# Patient Record
Sex: Female | Born: 1990 | Race: White | Hispanic: No | Marital: Married | Smoking: Never smoker
Health system: Southern US, Community
[De-identification: ages and names within clinical notes are randomized; demographics above are authoritative.]

## PROBLEM LIST (undated history)

## (undated) ENCOUNTER — Inpatient Hospital Stay (HOSPITAL_COMMUNITY): Payer: Self-pay

## (undated) DIAGNOSIS — N390 Urinary tract infection, site not specified: Secondary | ICD-10-CM

## (undated) DIAGNOSIS — G709 Myoneural disorder, unspecified: Secondary | ICD-10-CM

## (undated) DIAGNOSIS — F32A Depression, unspecified: Secondary | ICD-10-CM

## (undated) DIAGNOSIS — F329 Major depressive disorder, single episode, unspecified: Secondary | ICD-10-CM

## (undated) DIAGNOSIS — F419 Anxiety disorder, unspecified: Secondary | ICD-10-CM

## (undated) DIAGNOSIS — K589 Irritable bowel syndrome without diarrhea: Secondary | ICD-10-CM

## (undated) DIAGNOSIS — M4120 Other idiopathic scoliosis, site unspecified: Secondary | ICD-10-CM

## (undated) HISTORY — DX: Urinary tract infection, site not specified: N39.0

## (undated) HISTORY — DX: Irritable bowel syndrome, unspecified: K58.9

## (undated) HISTORY — DX: Major depressive disorder, single episode, unspecified: F32.9

## (undated) HISTORY — DX: Myoneural disorder, unspecified: G70.9

## (undated) HISTORY — DX: Other idiopathic scoliosis, site unspecified: M41.20

## (undated) HISTORY — DX: Depression, unspecified: F32.A

## (undated) HISTORY — DX: Anxiety disorder, unspecified: F41.9

---
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
## 2008-02-13 HISTORY — PX: WISDOM TOOTH EXTRACTION: SHX21

## 2008-03-17 ENCOUNTER — Ambulatory Visit: Payer: Self-pay | Admitting: Family Medicine

## 2008-03-17 LAB — CONVERTED CEMR LAB
Bilirubin Urine: NEGATIVE
Glucose, Urine, Semiquant: NEGATIVE
Ketones, urine, test strip: NEGATIVE
Nitrite: POSITIVE
Specific Gravity, Urine: <1.005
Urobilinogen, UA: 0.2
pH: 6

## 2008-06-04 ENCOUNTER — Ambulatory Visit: Payer: Self-pay | Admitting: Family Medicine

## 2008-06-04 DIAGNOSIS — R197 Diarrhea, unspecified: Secondary | ICD-10-CM | POA: Insufficient documentation

## 2008-06-04 DIAGNOSIS — R109 Unspecified abdominal pain: Secondary | ICD-10-CM | POA: Insufficient documentation

## 2008-08-02 ENCOUNTER — Ambulatory Visit: Payer: Self-pay | Admitting: Family Medicine

## 2008-08-02 DIAGNOSIS — K589 Irritable bowel syndrome without diarrhea: Secondary | ICD-10-CM | POA: Insufficient documentation

## 2008-08-02 LAB — CONVERTED CEMR LAB
Bilirubin Urine: NEGATIVE
Glucose, Urine, Semiquant: NEGATIVE
Ketones, urine, test strip: NEGATIVE
Nitrite: POSITIVE
Protein, U semiquant: 30
Specific Gravity, Urine: 1.015
Urobilinogen, UA: 0.2
pH: 6

## 2008-08-20 ENCOUNTER — Ambulatory Visit: Payer: Self-pay | Admitting: Family Medicine

## 2008-08-20 LAB — CONVERTED CEMR LAB
Bilirubin Urine: NEGATIVE
Casts: 0 /lpf
Epithelial cells, urine: 1 /lpf
Glucose, Urine, Semiquant: NEGATIVE
Ketones, urine, test strip: NEGATIVE
Nitrite: POSITIVE
Protein, U semiquant: 30
Specific Gravity, Urine: 1.015
Urine crystals, microscopic: 0 /hpf
Urobilinogen, UA: 0.2
Yeast, UA: 0
pH: 6

## 2008-08-21 ENCOUNTER — Encounter: Payer: Self-pay | Admitting: Family Medicine

## 2009-01-19 ENCOUNTER — Encounter: Payer: Self-pay | Admitting: Family Medicine

## 2009-01-19 ENCOUNTER — Ambulatory Visit: Payer: Self-pay | Admitting: Family Medicine

## 2009-01-19 DIAGNOSIS — J029 Acute pharyngitis, unspecified: Secondary | ICD-10-CM | POA: Insufficient documentation

## 2009-01-19 LAB — CONVERTED CEMR LAB: Rapid Strep: NEGATIVE

## 2009-01-20 ENCOUNTER — Telehealth: Payer: Self-pay | Admitting: Family Medicine

## 2010-03-15 NOTE — Assessment & Plan Note (Signed)
Summary: upset stomach/bir   Vital Signs:  Patient profile:   20 year old female Height:      65 inches Weight:      125.4 pounds BMI:     20.94 Temp:     97.7 degrees F oral Pulse rate:   80 / minute Pulse rhythm:   regular BP sitting:   90 / 60  (left arm) Cuff size:   regular  Vitals Entered By: Providence Crosby (June 04, 2008 1:58 PM) CC: gi upset// took goody powder and ibuprofen  for back pain   History of Present Illness: For the past 5 days she has been having stomach cramps and diarrhea. No  nausea/vomiting, no fever. Feels like stomach tied in knots, B low abdomen.. Relieved with Bm, temporarily. Worse ater meals but happens without eating as well.  6 BMs in last 24 hours. No blood in stool. No change in diet. Has had some intermiitant consipation in weeks prior.  No sick contacts.  Does drink alcohol but only 2 drinks on previous weekend.  Has had increase in stress this week with exam on Tuesday.  Took goody powder and ibuprofen  for back pain, but this had started prior toabove issues.   No recetn travel, no antibiotics since 03/2008.   Problems Prior to Update: 1)  Abdominal Cramps  (ICD-789.00) 2)  Diarrhea  (ICD-787.91) 3)  Uri  (ICD-465.9) 4)  Sinusitis- Acute-nos  (ICD-461.9) 5)  Uti  (ICD-599.0) 6)  Cellulitis, Right Leg  (ICD-682.6) 7)  Bee Sting Reaction, Local  (ICD-989.5) 8)  Hx of Premature Birth  (ICD-765.10) 9)  Hx of Scoliosis  (ICD-737.30)  Current Medications (verified): 1)  Loestrin Fe 1/20  Tabs (Norethin Ace-Eth Estrad-Fe Tabs) .... Take One By Mouth Daily As Directed Generic Kind Per Patient  Allergies: 1)  ! Keflex  Past History:  Past medical, surgical, family and social histories (including risk factors) reviewed, and no changes noted (except as noted below).  Past Surgical History:    Reviewed history from 06/11/2006 and no changes required:    5/07 pelvic US nl  Family History:    Reviewed history and no changes  required:       Twin sister with IBS.   Social History:    Reviewed history from 03/17/2008 and no changes required:       in college - for economics --is a senior at [employer] 03/2008       lives with her twin sister        no smoking or alcohol   Review of Systems General:  Complains of fatigue. CV:  Denies palpitations; Feel somewhat on edge...nervous. Resp:  Denies shortness of breath. GU:  Denies dysuria.  Physical Exam  General:  Well-developed,well-nourished,in no acute distress; alert,appropriate and cooperative throughout examination Mouth:  Oral mucosa and oropharynx without lesions or exudates.  Teeth in good repair. Neck:  no cervical or supraclavicular lymphadenopathy no carotid bruit or thyromegaly  Lungs:  Normal respiratory effort, chest expands symmetrically. Lungs are clear to auscultation, no crackles or wheezes. Heart:  Normal rate and regular rhythm. S1 and S2 normal without gallop, murmur, click, rub or other extra sounds. Abdomen:  Bowel sounds positive,abdomen soft and non-tender without masses, organomegaly or hernias noted.   Impression & Recommendations:  Problem # 1:  DIARRHEA (ICD-787.91) Likely IBS vs viral infection. Encourage increase in water, fiber and healthy eating habits.  Use yogurt with lactobacilli to improve intestinal flora.  If not improving follow up  for lab work up.  Problem # 2:  ABDOMINAL CRAMPS (ICD-789.00) no s/s of appendicitis. MAy use hyoscamine as needed severe abdominal cramps.   Complete Medication List: 1)  Loestrin Fe 1/20 Tabs (Norethin ace-eth estrad-fe tabs) .... Take one by mouth daily as directed generic kind per patient 2)  Hyoscyamine Sulfate 0.125 Mg Tbdp (Hyoscyamine sulfate) .Marland Kitchen.. 1 tab by mouth three times a day orn abdominal spasm  Patient Instructions: 1)  increase in water, fiber and healthy eating habits.  Use yogurt with lactobacilli to improve intestinal flora. 2)  Follow up if not improving with time.    Prescriptions: HYOSCYAMINE SULFATE 0.125 MG TBDP (HYOSCYAMINE SULFATE) 1 tab by mouth three times a day orn abdominal spasm  #15 x 0   Entered and Authorized by:   Kerby Nora MD   Signed by:   Kerby Nora MD on 06/04/2008   Method used:   Print then Give to Patient   RxID:   [id number]

## 2010-03-15 NOTE — Assessment & Plan Note (Signed)
Summary: ?UTI/CLE   Vital Signs:  Patient profile:   20 year old female Height:      65 inches Weight:      125 pounds Temp:     97.8 degrees F oral Pulse rate:   80 / minute Pulse rhythm:   regular BP sitting:   90 / 50  (left arm) Cuff size:   regular  Vitals Entered By: Providence Crosby LPN (August 20, 100 11:53 AM) CC: ? uti    History of Present Illness: was tx with macrobid in late june for uti -- thinks she was allergic l to it-- rash  was 7 day dose  after finishing it 2 d later -- broke out all over her body in red rash  bad itching / bumping  was also in [place]  uti got better quickly   last night - started to feel uti coming on  itch and then burning - with dysuria now  no blood in urine  no fever or nausea or vomiting is having some pressure/ soreness over bladder  no back pain     Allergies: 1)  ! Keflex 2)  ! * Macrobid  Past History:  Past Medical History: Last updated: 08/02/2008 IBS  Past Surgical History: Last updated: 06/11/2006 5/07 pelvic US nl  Family History: Last updated: 06/04/2008 Twin sister with IBS.   Social History: Last updated: 03/17/2008 in college - for economics --is a senior at [employer] 03/2008 lives with her twin sister  no smoking or alcohol   Risk Factors: Smoking Status: never (06/11/2006)  Review of Systems General:  Complains of fatigue; denies chills, fever, and loss of appetite. Resp:  Denies cough. GI:  Denies nausea and vomiting. GU:  Complains of dysuria and urinary frequency; denies discharge, hematuria, incontinence, and urinary hesitancy. Derm:  Denies itching and rash.  Physical Exam  General:  Well-developed,well-nourished,in no acute distress; alert,appropriate and cooperative throughout examination Head:  normocephalic, atraumatic, and no abnormalities observed.   Neck:  No deformities, masses, or tenderness noted. Lungs:  Normal respiratory effort, chest expands symmetrically. Lungs are clear to  auscultation, no crackles or wheezes. Heart:  Normal rate and regular rhythm. S1 and S2 normal without gallop, murmur, click, rub or other extra sounds. Abdomen:  mild suprapubic tenderness without rebound or gaurding  Msk:  no CVA tenderness  Skin:  Intact without suspicious lesions or rashes Inguinal Nodes:  No significant adenopathy Psych:  normal affect, talkative and pleasant    Impression & Recommendations:  Problem # 1:  UTI (ICD-599.0) Assessment Deteriorated with prior all/rash from macrobid will cover with cipro and update disc ways to prev utis- handout given  if re- occurs frequently- consider urol eval The following medications were removed from the medication list:    Macrobid 100 Mg Caps (Nitrofurantoin monohyd macro) .Marland Kitchen... 1 by mouth two times a day Her updated medication list for this problem includes:    Cipro 250 Mg Tabs (Ciprofloxacin hcl) .Marland Kitchen... 1 by mouth two times a day for 7 days  Orders: T-Culture, Urine ([id number]) UA Dipstick W/ Micro (manual) (47425)  Complete Medication List: 1)  Loestrin Fe 1/20 Tabs (Norethin ace-eth estrad-fe tabs) .... Take one by mouth daily as directed generic kind per patient 2)  Hyoscyamine Sulfate 0.125 Mg Tabs (Hyoscyamine sulfate) .Marland Kitchen.. 1 by mouth q 4 hours as needed abdominal spasm 3)  Cipro 250 Mg Tabs (Ciprofloxacin hcl) .Marland Kitchen.. 1 by mouth two times a day for 7 days  Patient Instructions:  1)  continue drinking lots of water 2)  call or seek care is symptoms don't improve in 2-3 days or if you develop back pain, nausea, or vomiting 3)  take the cipro as directed for uti 4)  will call when culture returns  5)  if you continue to have frequent utis- may need to discuss urology evaluation  Prescriptions: CIPRO 250 MG TABS (CIPROFLOXACIN HCL) 1 by mouth two times a day for 7 days  #14 x 0   Entered and Authorized by:   Judith Part MD   Signed by:   Judith Part MD on 08/20/2008   Method used:   Print then Give to  Patient   RxID:   [id number]   Laboratory Results   Urine Tests  Date/Time Recieved: August 20, 2008 11:54 AM Date/Time Reported: August 20, 2008 11:54 AM  Routine Urinalysis   Color: yellow Appearance: Cloudy Glucose: negative   (Normal Range: Negative) Bilirubin: negative   (Normal Range: Negative) Ketone: negative   (Normal Range: Negative) Spec. Gravity: 1.015   (Normal Range: 1.003-1.035) Blood: small   (Normal Range: Negative) pH: 6.0   (Normal Range: 5.0-8.0) Protein: 30   (Normal Range: Negative) Urobilinogen: 0.2   (Normal Range: 0-1) Nitrite: positive   (Normal Range: Negative) Leukocyte Esterace: small   (Normal Range: Negative)  Urine Microscopic WBC/hpf: 3-4 RBC/hpf: 2-3 Bacteria: many Mucous: few Epithelial: 1 Crystals/LPF: 0 Casts/LPF: 0 Yeast/HPF: 0 Other: 0

## 2010-03-15 NOTE — Assessment & Plan Note (Signed)
Summary: ST/CLE   Vital Signs:  Patient profile:   20 year old female Height:      65 inches Weight:      124 pounds BMI:     20.71 Temp:     98.5 degrees F oral Pulse rate:   80 / minute Pulse rhythm:   regular BP sitting:   102 / 70  (left arm) Cuff size:   regular  Vitals Entered By: Delilah Shan CMA Duncan Dull) (January 19, 2009 11:50 AM) CC: ST   History of Present Illness: 20 yo with sore throat since Saturday. No fever, no cough, no runny nose, no n/v/d, no rash. No chills. Has not tried anything OTC. No sick contacts.  Allergies: 1)  ! Keflex 2)  ! * Macrobid  Review of Systems      See HPI General:  Denies chills, fever, loss of appetite, and malaise. ENT:  Complains of sore throat; denies earache, nasal congestion, postnasal drainage, and sinus pressure. GI:  Denies abdominal pain, diarrhea, nausea, and vomiting.  Physical Exam  General:  Well-developed,well-nourished,in no acute distress; alert,appropriate and cooperative throughout examination Ears:  External ear exam shows no significant lesions or deformities.  Otoscopic examination reveals clear canals, tympanic membranes are intact bilaterally without bulging, retraction, inflammation or discharge. Hearing is grossly normal bilaterally. Nose:  no external deformity.   Mouth:  mild erythema, no exudates. Neck:  No deformities, masses, or tenderness noted. No adenopathy. Lungs:  Normal respiratory effort, chest expands symmetrically. Lungs are clear to auscultation, no crackles or wheezes. Heart:  Normal rate and regular rhythm. S1 and S2 normal without gallop, murmur, click, rub or other extra sounds.   Impression & Recommendations:  Problem # 1:  ACUTE PHARYNGITIS (ICD-462) Assessment New Likely viral.   No cardinal signs of strep- afebrile, no adenopathy, no exudate.  Rapid strep neg.  Advised Tylenol/Motrin for supportive care.  Red flags (above cardinal signs) given for follow up.  Complete  Medication List: 1)  Loestrin Fe 1/20 Tabs (Norethin ace-eth estrad-fe tabs) .... Take one by mouth daily as directed generic kind per patient 2)  Hyoscyamine Sulfate 0.125 Mg Tabs (Hyoscyamine sulfate) .Marland Kitchen.. 1 by mouth q 4 hours as needed abdominal spasm  Current Allergies (reviewed today): ! [facility] ! * MACROBID  Laboratory Results   Date/Time Reported: January 19, 2009 12:04 PM   Other Tests  Rapid Strep: negative

## 2010-03-15 NOTE — Assessment & Plan Note (Signed)
Summary: SINUS INFECTION/HEA   Vital Signs:  Patient Profile:   20 Years Old Female Height:     64 inches (162.56 cm) Weight:      124 pounds Temp:     97.9 degrees F oral Pulse rate:   88 / minute Pulse rhythm:   regular BP sitting:   100 / 50  (left arm) Cuff size:   regular  Vitals Entered By: Lowella Petties (December 31, 2006 8:24 AM)                 Chief Complaint:  Cough and congestion.  History of Present Illness: had 2 weeks of cold symptoms- then much worse on monday really bad congestion in nose- miserable today- not as bad but cough is gettin worse had a fever for last few days is having some sinus headaches- with yellow /green to clear drainage  did use 4 way nasal spray  Current Allergies: ! KEFLEX     Review of Systems      See HPI  General      Complains of fatigue and fever.  Eyes      Complains of eye irritation.  ENT      Complains of nasal congestion, postnasal drainage, sinus pressure, and sore throat.      Denies earache.      st was worse for a while  Resp      Complains of cough and sputum productive.      a little productive  GI      Denies diarrhea, nausea, and vomiting.  Derm      Denies rash.  Psych      Denies anxiety and depression.   Physical Exam  General:     Well-developed,well-nourished,in no acute distress; alert,appropriate and cooperative throughout examination Head:     normocephalic, atraumatic, and no abnormalities observed.  sinus tenderness ethmoid and maxillary Eyes:     vision grossly intact, pupils equal, pupils round, pupils reactive to light, and no injection.   Ears:     R ear normal and L ear normal.   Nose:     mucosal erythema and mucosal edema.   Mouth:     pharynx pink and moist.   Neck:     No deformities, masses, or tenderness noted. Lungs:     Normal respiratory effort, chest expands symmetrically. Lungs are clear to auscultation, no crackles or wheezes. Heart:     Normal  rate and regular rhythm. S1 and S2 normal without gallop, murmur, click, rub or other extra sounds. Skin:     Intact without suspicious lesions or rashes Cervical Nodes:     No lymphadenopathy noted Psych:     nl affect, pleasant    Impression & Recommendations:  Problem # 1:  SINUSITIS- ACUTE-NOS (ICD-461.9) will tx with bactrim and fluids/sympt care stop the otc 4 way ns- as it could cause rebound congestion- try saline instead adv to update if worse or not inp in 4-5 days Her updated medication list for this problem includes:    Bactrim Ds 800-160 Mg Tabs (Sulfamethoxazole-trimethoprim) .Marland Kitchen... 1 tab by mouth two times daily for 10 days   Complete Medication List: 1)  Loestrin Fe 1/20 Tabs (Norethin ace-eth estrad-fe tabs) .... Take one by mouth daily as directed 2)  Bactrim Ds 800-160 Mg Tabs (Sulfamethoxazole-trimethoprim) .Marland Kitchen.. 1 tab by mouth two times daily for 10 days   Patient Instructions: 1)  you can try mucinex over the counter twice daily as directed  and nasal saline spray for congestion 2)  tylenol over the counter as directed may help with aches, headache and fever 3)  call if symptoms worsen or if not improved in 4-5 days 4)  take the bactrim with full meals as directed    Prescriptions: BACTRIM DS 800-160 MG  TABS (SULFAMETHOXAZOLE-TRIMETHOPRIM) 1 tab by mouth two times daily for 10 days  #20 x 0   Entered and Authorized by:   Judith Part MD   Signed by:   Judith Part MD on 12/31/2006   Method used:   Print then Give to Patient   RxID:   [id number]  ]

## 2010-03-15 NOTE — Progress Notes (Signed)
Summary: Office Visit  Office Visit   Imported By: Beau Fanny 06/25/2006 13:24:59  _____________________________________________________________________  External Attachment:    Type:   Image     Comment:   External Document

## 2010-03-15 NOTE — Letter (Signed)
Summary: Out of Work  [facility]  [address] [place], [place] [address]   Phone: [phone]  Fax: [phone]    January 19, 2009   Employee:  [name]    To Whom It May Concern:   To whom it may concern, Karen Booker was seen in our office today.  Please excuse her.   If you need additional information, please feel free to contact our office.         Sincerely,    Ruthe Mannan MD

## 2010-03-15 NOTE — Assessment & Plan Note (Signed)
Summary: CPX/BIR   Vital Signs:  Patient Profile:   20 Years Old Female Height:     64 inches (162.56 cm) Weight:      124 pounds Temp:     98 degrees F oral Pulse rate:   86 / minute Pulse rhythm:   regular BP sitting:   92 / 50  (left arm) Cuff size:   regular  Vitals Entered By: Lowella Petties (Jun 24, 2006 3:01 PM)               Chief Complaint:  check up.  History of Present Illness: note- this exam was done on paper and will be scanned in due to computer outage at the time              Meningococcal Vaccine    Vaccine Type: Menactra    Site: left deltoid    Mfr: sanofi pasteur    Dose: 0.5 ml    Route: IM    Given by: Lowella Petties    Exp. Date: 04/22/2007    Lot #: [id number]

## 2010-03-15 NOTE — Progress Notes (Signed)
Summary: more syptoms  Phone Note Call from Patient Call back at [phone]   Caller: Patient Call For: Dr. Dayton Martes Summary of Call: Pt says she has developed a fever and vomiting. Please advise Initial call taken by: Silas Sacramento CMA,  January 20, 2009 9:23 AM  Follow-up for Phone Call        Rapd strep was negative but if she is having fevers, vomiting, and sore throat, I'm ok with treating her for strep.  I will send penicillin to her pharmacy if you find out which one. Follow-up by: Ruthe Mannan MD,  January 20, 2009 9:39 AM  Additional Follow-up for Phone Call Additional follow up Details #1::        Left message on voicemail  to return call with pharmacy information.  Lugene Fuquay CMA (AAMA)  January 20, 2009 11:21 AM   [facility] [address]. Additional Follow-up by: Delilah Shan CMA (AAMA),  January 20, 2009 11:26 AM    New/Updated Medications: PENICILLIN V POTASSIUM 500 MG TABS (PENICILLIN V POTASSIUM) 1 tab by mouth three times a day x 10 days. Prescriptions: PENICILLIN V POTASSIUM 500 MG TABS (PENICILLIN V POTASSIUM) 1 tab by mouth three times a day x 10 days.  #30 x 0   Entered and Authorized by:   Ruthe Mannan MD   Signed by:   Ruthe Mannan MD on 01/20/2009   Method used:   Electronically to        [facility] [address] (retail)       [phone]       [place], [place]  [address]       Ph: [phone]       Fax: [phone]   RxID:   [id number]

## 2010-03-15 NOTE — Assessment & Plan Note (Signed)
Summary: UTI   Vital Signs:  Patient Profile:   20 Years Old Female Height:     64 inches (162.56 cm) Weight:      126.50 pounds Temp:     98.1 degrees F oral Pulse rate:   112 / minute Pulse rhythm:   regular BP sitting:   106 / 74  (left arm) Cuff size:   regular  Vitals Entered By: Delilah Shan (November 28, 2006 11:42 AM)                 Chief Complaint:  ? UTI.  Acute Visit History:      The patient complains of genitourinary symptoms.  The genitourinary symptoms have been present for 4 days.  She notes a history of dysuria and frequency.  She denies fever, flank pain, hematuria, nausea, perineal itch, urgency, vaginal discharge, vaginal odor, vaginal redness, vaginal swelling, or vomiting.  She is currently on an oral contraceptive agent.  The patient has no history of pyelonephritis or kidney stones.  Antibiotics have not been used within the last 4 weeks.  She has not had 3 or more urinary tract infections in the last 12 months.  Comments: chemical smell of urine, occ low abd pain with urinating.         Current Allergies (reviewed today): ! KEFLEX     Review of Systems      See HPI   Physical Exam  General:     Well-developed,well-nourished,in no acute distress; alert,appropriate and cooperative throughout examination Abdomen:     central low abd ttp, mild, no rebund, no CVA tenderness    Impression & Recommendations:  Problem # 1:  UTI (ICD-599.0)  The following medications were removed from the medication list:    Keflex 500 Mg Caps (Cephalexin) .Marland Kitchen... 1 by mouth two times a day for 7 days  Her updated medication list for this problem includes:    Bactrim Ds 800-160 Mg Tabs (Sulfamethoxazole-trimethoprim) .Marland Kitchen... 1 tab by mouth two times a day x 3 days  Orders: UA Dipstick W/ Micro (81[address])  Encouraged to push clear liquids, get enough rest, and take acetaminophen as needed. To be seen in 10 days if no improvement, sooner if worse.   Complete  Medication List: 1)  Loestrin Fe 1/20 Tabs (Norethin ace-eth estrad-fe tabs) .... Take one by mouth daily as directed 2)  Bactrim Ds 800-160 Mg Tabs (Sulfamethoxazole-trimethoprim) .Marland Kitchen.. 1 tab by mouth two times a day x 3 days     Prescriptions: BACTRIM DS 800-160 MG  TABS (SULFAMETHOXAZOLE-TRIMETHOPRIM) 1 tab by mouth two times a day x 3 days  #6 x 0   Entered and Authorized by:   Kerby Nora MD   Signed by:   Kerby Nora MD on 11/28/2006   Method used:   Print then Give to Patient   RxID:   [id number]  ] Current Allergies (reviewed today): ! KEFLEX Current Medications (including changes made in today's visit):  LOESTRIN FE 1/20  TABS (NORETHIN ACE-ETH ESTRAD-FE TABS) Take one by mouth daily as directed BACTRIM DS 800-160 MG  TABS (SULFAMETHOXAZOLE-TRIMETHOPRIM) 1 tab by mouth two times a day x 3 days   Laboratory Results   Urine Tests  Date/Time Recieved: November 28, 2006 11:55 AM  Date/Time Reported: November 28, 2006 11:55 AM   Routine Urinalysis   Color: yellow Appearance: Clear Glucose: negative   (Normal Range: Negative) Bilirubin: negative   (Normal Range: Negative) Ketone: negative   (Normal Range: Negative) Spec. Gravity: 1.020   (  Normal Range: 1.003-1.035) Blood: small   (Normal Range: Negative) pH: 6.5   (Normal Range: 5.0-8.0) Protein: trace   (Normal Range: Negative) Urobilinogen: negative   (Normal Range: 0-1) Nitrite: negative   (Normal Range: Negative) Leukocyte Esterace: negative   (Normal Range: Negative)  Urine Microscopic WBC/hpf: 10 RBC/hpf: 5-6 Epithelial: occ

## 2010-03-15 NOTE — Assessment & Plan Note (Signed)
Summary: BEE STING ON KNEE/CLE   Vital Signs:  Patient Profile:   20 Years Old Female Height:     64 inches (162.56 cm) Weight:      125 pounds Temp:     97.9 degrees F oral Pulse rate:   88 / minute Pulse rhythm:   regular BP sitting:   90 / 50  (left arm) Cuff size:   regular  Vitals Entered ByProvidence Crosby (September 25, 2006 2:05 PM)               Chief Complaint:  bee sting not heeling.  History of Present Illness: 1 week ago got stung by a bee at the gas station was painful immediately- took a while to get stinger and venom sack put ice on it took benadryl a few times still itchy, and sore- and has become bigger each day no drainage no fever, no sob or mouth or throat swelling did put some benadryl cream on it  Current Allergies: No known allergies      Review of Systems      See HPI  General      Denies chills, fatigue, and fever.  ENT      Denies sore throat.  Resp      Denies shortness of breath and wheezing.  MS      Denies joint pain.  Derm      Complains of insect bite(s) and itching.  Neuro      Denies numbness.   Physical Exam  General:     Well-developed,well-nourished,in no acute distress; alert,appropriate and cooperative throughout examination Head:     Normocephalic and atraumatic without obvious abnormalities. No apparent alopecia or balding. Eyes:     no injection.  no swelling Mouth:     pharynx pink and moist.  no  mouth or throat swelling Neck:     No deformities, masses, or tenderness noted.no cervical lymphadenopathy.   Lungs:     Normal respiratory effort, chest expands symmetrically. Lungs are clear to auscultation, no crackles or wheezes. Heart:     Normal rate and regular rhythm. S1 and S2 normal without gallop, murmur, click, rub or other extra sounds. Extremities:     No clubbing, cyanosis, edema, or deformity noted with normal full range of motion of all joints.   Skin:     area of erythema/heat/induration  on inner R knee and thigh with some tenderness, small scab in middle, and no weeping or draining Psych:     nl affect, cheerful    Impression & Recommendations:  Problem # 1:  BEE STING REACTION, LOCAL (ICD-989.5) with itching and redness that is worsening some worry for bacterial infx/cellulitis will start keflex 500 two times a day can try cool compresses and elocon cream for itch pt adv to call if redness increases (area was marked with a pen so she can tell)- or fever or other symptoms   Problem # 2:  CELLULITIS, RIGHT LEG (ICD-682.6) see above, oral abx and observation pt is aware that abx may affect eff of OC and to use back up protection Her updated medication list for this problem includes:    Keflex 500 Mg Caps (Cephalexin) .Marland Kitchen... 1 by mouth two times a day for 7 days   Complete Medication List: 1)  Loestrin Fe 1/20 Tabs (Norethin ace-eth estrad-fe tabs) .... Take one by mouth daily as directed 2)  Keflex 500 Mg Caps (Cephalexin) .Marland Kitchen.. 1 by mouth two times a day for 7  days 3)  Elocon 0.1 % Crea (Mometasone furoate) .... Apply to affected area once daily prn   Patient Instructions: 1)  you can use cool compresses on the bite 2)  keep it clean with soap and water or rubbing alcholol 3)  elocon cream is once daily 4)  keflex is twice daily (you can take it with food) 5)  if more redness, pain or any fever-call    Prescriptions: ELOCON 0.1 %  CREA (MOMETASONE FUROATE) apply to affected area once daily prn  #1 small tube x 0   Entered and Authorized by:   Judith Part MD   Signed by:   Judith Part MD on 09/25/2006   Method used:   Print then Give to Patient   RxID:   [id number] KEFLEX 500 MG  CAPS (CEPHALEXIN) 1 by mouth two times a day for 7 days  #14 x 0   Entered and Authorized by:   Judith Part MD   Signed by:   Judith Part MD on 09/25/2006   Method used:   Print then Give to Patient   RxID:   [id number]

## 2010-03-15 NOTE — Assessment & Plan Note (Signed)
Summary: COUGH,CONGESTION/CLE   Vital Signs:  Patient Profile:   20 Years Old Female Height:     64 inches (162.56 cm) Weight:      131 pounds Temp:     98.2 degrees F oral Pulse rate:   84 / minute Pulse rhythm:   regular BP sitting:   120 / 80  (right arm) Cuff size:   regular  Vitals Entered By: Liane Comber (November 27, 2007 3:46 PM)                 Chief Complaint:  cough and congestion.  History of Present Illness: started last wed with a sore throat -- in the back /sharp pain then swollen friday really tired and congested and feverish  really bad sunday -no appetite over the past week - a little better  feverish yestercday   feels better but not great  st is better  congestion -- mostly clear d/c  cough - prod -- was more yellow but now clear no wheeze  ears feels like fullness/ and pressure  vomted once due to st-- none since  no nausea or diarrhea   had some sinus pressure under the eyes- better now     Current Allergies (reviewed today): ! [facility]  Past Surgical History:    Reviewed history from 06/11/2006 and no changes required:       5/07 pelvic US nl   Social History:    in college - for economics     lives with her twin sister     no smoking or alcohol     Review of Systems  General      Complains of fatigue and loss of appetite.      Denies malaise.  Eyes      Denies blurring, discharge, and eye irritation.  ENT      Complains of hoarseness, nasal congestion, postnasal drainage, and sore throat.      Denies ear discharge.  CV      Denies chest pain or discomfort, palpitations, and shortness of breath with exertion.  Resp      Complains of cough.      Denies pleuritic, shortness of breath, and wheezing.  GI      Denies change in bowel habits.  Derm      Denies itching and rash.  Psych      mood is good    Physical Exam  General:     Well-developed,well-nourished,in no acute distress; alert,appropriate and  cooperative throughout examination Head:     normocephalic, atraumatic, and no abnormalities observed.  no sinus tenderness  Eyes:     vision grossly intact, pupils equal, pupils round, pupils reactive to light, and no injection.   Ears:     R ear normal and L ear normal.   Nose:     nares congested with clear rhinorrhea  Mouth:     pharynx pink and moist, no erythema, and no exudates.   Neck:     No deformities, masses, or tenderness noted. Lungs:     Normal respiratory effort, chest expands symmetrically. Lungs are clear to auscultation, no crackles or wheezes. Heart:     Normal rate and regular rhythm. S1 and S2 normal without gallop, murmur, click, rub or other extra sounds. Skin:     Intact without suspicious lesions or rashes Cervical Nodes:     No lymphadenopathy noted Psych:     normal affect, talkative and pleasant     Impression & Recommendations:  Problem # 1:  URI (ICD-465.9) Assessment: New will tx symptomatically  fluids and rest  expectorant and saline ns as needed  update if worse/fever or if not imp in 1 week   Complete Medication List: 1)  Loestrin Fe 1/20 Tabs (Norethin ace-eth estrad-fe tabs) .... Take one by mouth daily as directed   Patient Instructions: 1)  get plenty of rest and lots of fluids  2)  try mucinex DM for congestion and cough , and nasal saline spray is good for nasal congestion 3)  aleve is ok for headache / fever  4)  update me if not improving in 3-4 days  5)  update me if facial pain, return of fever, or worse productive cough   ]  Appended Document: Orders Update    Clinical Lists Changes  Orders: Added new Service order of Est. Patient Level III (16109) - Signed

## 2010-04-17 ENCOUNTER — Encounter: Payer: Self-pay | Admitting: Family Medicine

## 2010-04-17 ENCOUNTER — Ambulatory Visit (INDEPENDENT_AMBULATORY_CARE_PROVIDER_SITE_OTHER): Payer: 59 | Admitting: Family Medicine

## 2010-04-17 DIAGNOSIS — J029 Acute pharyngitis, unspecified: Secondary | ICD-10-CM

## 2010-04-17 LAB — CONVERTED CEMR LAB: Rapid Strep: NEGATIVE

## 2010-04-25 NOTE — Assessment & Plan Note (Signed)
Summary: ??flu   Vital Signs:  Patient profile:   20 year old female Height:      65 inches Weight:      127.25 pounds BMI:     21.25 Temp:     98.3 degrees F oral Pulse rate:   118 / minute Pulse rhythm:   regular BP sitting:   126 / 78  (left arm) Cuff size:   regular  Vitals Entered By: Selena Batten Dance CMA (AAMA) (April 17, 2010 11:22 AM) CC: ? Flu Comments Cough and fever x1 week. Roomate has same thing and was diagnosed with flu at [facility] yesterday.    History of Present Illness: CC: ? flu  1wk h/o dry cough, last few days bad sore throat with cough, now productive of yellow/clear mucous.  + fever initially to 100.  Has tried dayquil and cough drops.  + HA.  has been trying to stay hydrated.  + body aches last week.  + nasal congestion last 3 days.  No abd pain, n/v/d, rashes, myalgias, arthralgias.    thinks roommate has had flu and gave to her.  Everyone at home sick.  no h/o asthma/allergies, no smokers at home.  Current Medications (verified): 1)  Loestrin Fe 1/20  Tabs (Norethin Ace-Eth Estrad-Fe Tabs) .... Take One By Mouth Daily As Directed Generic Kind Per Patient 2)  Hyoscyamine Sulfate 0.125 Mg Tabs (Hyoscyamine Sulfate) .Marland Kitchen.. 1 By Mouth Q 4 Hours As Needed Abdominal Spasm  Allergies (verified): 1)  ! Keflex 2)  ! * Macrobid  Past History:  Past Medical History: Last updated: 08/02/2008 IBS  Social History: Last updated: 03/17/2008 in college - for economics --is a senior at [employer] 03/2008 lives with her twin sister  no smoking or alcohol   Review of Systems       per HPI  Physical Exam  General:  Well-developed,well-nourished,in no acute distress; alert,appropriate and cooperative throughout examination Head:  normocephalic, atraumatic, and no abnormalities observed.  no sinus tenderness Eyes:  PERRLA, EOMI, no injection Ears:  Tms clear bilaterally, no cerumen Nose:  nares clear bilaterally Mouth:  ++ erythema, no tonsillar enlargement  or exudates. Neck:  shotty AC LAD Lungs:  Normal respiratory effort, chest expands symmetrically. Lungs are clear to auscultation, no crackles or wheezes. Heart:  Normal rate and regular rhythm. S1 and S2 normal without gallop, murmur, click, rub or other extra sounds. Abdomen:  no HSM Pulses:  + rad pulses, brisk cap refill Extremities:  no pedal edema Skin:  Intact without suspicious lesions or rashes   Impression & Recommendations:  Problem # 1:  ACUTE PHARYNGITIS (ICD-462)  likely viral.  rapid strep negative.  supportive care.  red flags to return or seek care discussed - any SOB, pain, worsening rapid heart rate, n/v, fever >101.5.  encouraged push fluids to prevent dehydration as tachycardic today.  Orders: Rapid Strep (45409)  Complete Medication List: 1)  Loestrin Fe 1/20 Tabs (Norethin ace-eth estrad-fe tabs) .... Take one by mouth daily as directed generic kind per patient 2)  Hyoscyamine Sulfate 0.125 Mg Tabs (Hyoscyamine sulfate) .Marland Kitchen.. 1 by mouth q 4 hours as needed abdominal spasm 3)  Cheratussin Ac 100-10 Mg/51ml Syrp (Guaifenesin-codeine) .... One teaspoon at bedtime as needed cough  Patient Instructions: 1)  Sounds like you have upper respiratory infection, viral.   2)  Symptoms usually last 7-10 days.  Cough can last a few weeks after. 3)  Ensure you get plenty of fluid to prevent dehydration.  4)  Cheratussin for cough at night. 5)  Take ibuprofen for throat inflammation. 6)  Suck on cold things like popsicles or warm things like herbal teas (whichever soothes your throat better). 7)  Salt water gargles, consider throat lozenges/chloraseptic. 8)  Return if you are not improving as expected, or if you have high fevers (>101.5) or difficulty swallowing. 9)  Call clinic with questions.  Pleasure to see you today. Prescriptions: CHERATUSSIN AC 100-10 MG/5ML SYRP (GUAIFENESIN-CODEINE) one teaspoon at bedtime as needed cough  #100cc x 0   Entered and Authorized by:    Eustaquio Boyden  MD   Signed by:   Eustaquio Boyden  MD on 04/17/2010   Method used:   Print then Give to Patient   RxID:   [id number]    Orders Added: 1)  Est. Patient Level III [56213] 2)  Rapid Strep [08657]    Laboratory Results  Date/Time Received: April 17, 2010 11:52 AM  Date/Time Reported: April 17, 2010 11:52 AM   Other Tests  Rapid Strep: negative

## 2010-05-03 ENCOUNTER — Encounter: Payer: Self-pay | Admitting: Family Medicine

## 2010-05-04 ENCOUNTER — Ambulatory Visit (INDEPENDENT_AMBULATORY_CARE_PROVIDER_SITE_OTHER): Payer: 59 | Admitting: Family Medicine

## 2010-05-04 ENCOUNTER — Encounter: Payer: Self-pay | Admitting: Family Medicine

## 2010-05-04 VITALS — BP 100/70 | HR 64 | Temp 98.8°F | Ht 64.0 in | Wt 125.8 lb

## 2010-05-04 DIAGNOSIS — N390 Urinary tract infection, site not specified: Secondary | ICD-10-CM

## 2010-05-04 DIAGNOSIS — R300 Dysuria: Secondary | ICD-10-CM | POA: Insufficient documentation

## 2010-05-04 LAB — POCT URINALYSIS DIPSTICK
Bilirubin, UA: NEGATIVE
Glucose, UA: NEGATIVE
Ketones, UA: NEGATIVE
Leukocytes, UA: NEGATIVE
Protein, UA: NEGATIVE
Spec Grav, UA: 1.015
Urobilinogen, UA: NEGATIVE
pH, UA: 6.5

## 2010-05-04 NOTE — Assessment & Plan Note (Signed)
UA clear, check cx. Suspect not UTI, may be irritation from intercourse, now improved.

## 2010-05-04 NOTE — Progress Notes (Signed)
20 year old:  On Tuesday night, got a lot of burning in the middle of the night, and still had a lot of burning. Feels pretty ok last night.  No recent UTI. Increased urgency -- now improved Had intercourse Tues. No discharge or STD concerns.  [phone] [facility] on [address].  ROS: GEN: No acute illnesses, no fevers, chills. GI: No n/v/d, eating normally Pulm: No SOB Interactive and getting along well at home.  Otherwise, ROS is as per the HPI.   GEN: WDWN, NAD, Non-toxic, A & O x 3 HEENT: Atraumatic, Normocephalic. Neck supple. No masses, No LAD. Ears and Nose: No external deformity. ABD: S, NT, ND, +BS. No rebound tenderness. No HSM. No CVAT EXTR: No c/c/e NEURO Normal gait.  PSYCH: Normally interactive. Conversant. Not depressed or anxious appearing.  Calm demeanor.

## 2010-05-04 NOTE — Progress Notes (Deleted)
  Subjective:    Patient ID: Karen Booker, female    DOB: 03/31/1990, 20 y.o.   MRN: [id number]  HPI    Review of Systems     Objective:   Physical Exam        Assessment & Plan:

## 2010-05-09 NOTE — Progress Notes (Signed)
i may have signed this in Dr. Royden Purl absence. I believe no further action needs to be done. Normal UA

## 2010-11-09 ENCOUNTER — Ambulatory Visit (INDEPENDENT_AMBULATORY_CARE_PROVIDER_SITE_OTHER): Payer: 59 | Admitting: Family Medicine

## 2010-11-09 ENCOUNTER — Encounter: Payer: Self-pay | Admitting: Family Medicine

## 2010-11-09 VITALS — BP 108/74 | HR 88 | Temp 98.4°F | Wt 123.0 lb

## 2010-11-09 DIAGNOSIS — K130 Diseases of lips: Secondary | ICD-10-CM

## 2010-11-09 MED ORDER — VALACYCLOVIR HCL 1 G PO TABS: 2000.0000 mg | ORAL_TABLET | Freq: Two times a day (BID) | ORAL | Status: DC

## 2010-11-09 NOTE — Patient Instructions (Addendum)
Doesn't look like a cold sore. Will give you medicine in case develops into one - watch out for worsening pain or develops into larger blister. Looks more like chapped lips on the corner. Try chap stick to see if we can help resolve quicker. Update [place] if not improving as expected.

## 2010-11-09 NOTE — Progress Notes (Signed)
  Subjective:    Patient ID: Karen Booker, female    DOB: [birth year], 20 y.o.   MRN: [id number]  HPI CC: cold sore?  2 nights ago felt tingling left upper lip.  New lumps on outside of lips.  Not tender.  No sores inside mouth.  No other rashes anywhere.  No cold sores in past.  Has had allergic rxn at lips in past, unsure what to.  Recently changed diet (paleo diet) (some new fruits and vegetables), exercising more, out in sun more, more stressed.  No new cosmetics, lotions, soaps, medicines.  Review of Systems Per HPI    Objective:   Physical Exam  Nursing note and vitals reviewed. Constitutional: She appears well-developed and well-nourished. No distress.  Skin: Skin is warm. No pallor.       Edges of lips with dry irritated, slightly erythematous.  Faint bumps on corners as well - papules not vesicular rash.          Assessment & Plan:

## 2010-11-09 NOTE — Assessment & Plan Note (Signed)
More like angular cheilitis.  Discussed importance of balanced diet.  Pt states she intermittently gets MVI. Anticipate due to recent increase in exercise/running leading to dry lips - rec chap stick. Given initial paresthesias, will provide with script for valcyclovir to take in case developing into cold sore. Update [place] if not improving as expected.

## 2010-11-14 ENCOUNTER — Telehealth: Payer: Self-pay | Admitting: *Deleted

## 2010-11-14 NOTE — Telephone Encounter (Signed)
Needs to return for this - different from rash described previously.

## 2010-11-14 NOTE — Telephone Encounter (Signed)
Pt was seen last week for a rash on her mouth.  This is spreading all over her lips, some on her face, down her back and neck.  Itching, swells when she scratches.  She is asking if she needs to be seen again for this or if something can be called to [facility] [address].

## 2010-11-14 NOTE — Telephone Encounter (Signed)
Advised pt, appt made for Thursday.

## 2010-11-15 ENCOUNTER — Encounter: Payer: Self-pay | Admitting: Family Medicine

## 2010-11-15 ENCOUNTER — Ambulatory Visit (INDEPENDENT_AMBULATORY_CARE_PROVIDER_SITE_OTHER): Payer: 59 | Admitting: Family Medicine

## 2010-11-15 DIAGNOSIS — L239 Allergic contact dermatitis, unspecified cause: Secondary | ICD-10-CM

## 2010-11-15 DIAGNOSIS — L259 Unspecified contact dermatitis, unspecified cause: Secondary | ICD-10-CM

## 2010-11-15 MED ORDER — PREDNISONE 50 MG PO TABS: 50.0000 mg | ORAL_TABLET | Freq: Every day | ORAL | Status: AC

## 2010-11-15 NOTE — Patient Instructions (Addendum)
Try Prednisone 50mg  in AM.  Call if sxs don't resolve.

## 2010-11-15 NOTE — Assessment & Plan Note (Signed)
Assume due to new foods as it started on the lip. Avoid foods and if starts back, do so one at a time every two weeks. Pulse of steroids for a few days. Call if doesn't resolve for Derm referral.

## 2010-11-15 NOTE — Progress Notes (Signed)
  Subjective:    Patient ID: Karen Booker, female    DOB: December 18, 1990, 20 y.o.   MRN: [id number]  HPI Pt of Dr Royden Purl seen by Dr Reece Agar last week for a lip rash problem. She had started with itchy bumps on the upper left lip and was seen Thu, given Valtrex and it was not taken because she did not think it became a cold sore. She is having some other rash behind her both ears, left lower chin, near left innner canthus of left eye and near her nostrils.   She has history of [facility] with bad reaction historically, none however since early teens and no known exposure.  She has been trying some new foods her boyfriend brought [place] from the [place] area, none of which she had eaten before. Papaya, some various nuts and a few other things. She has stopped all of these since Sun and has had Benadryl altho it makes her sleepy. She otherwise feels well.    Review of SystemsNoncontributory except as above.       Objective:   Physical ExamWDWN WF NAD HEENT Nml, Heart and Lungs nml; Vessicular sparse rash somewhat coalescent linearly on the left angle of the lips, slightly around to the lower expanse, circularly coalescent behind the ears R>L approx 1cm diam, slightly in the inner canthus of the left eye, approx 5mm in diam and a few individually scattered areas, one on the right cheek and one on the left zygoma.         Assessment & Plan:

## 2010-11-16 ENCOUNTER — Ambulatory Visit: Payer: 59 | Admitting: Family Medicine

## 2010-12-27 ENCOUNTER — Encounter: Payer: Self-pay | Admitting: Family Medicine

## 2010-12-27 ENCOUNTER — Ambulatory Visit (INDEPENDENT_AMBULATORY_CARE_PROVIDER_SITE_OTHER): Payer: 59 | Admitting: Family Medicine

## 2010-12-27 DIAGNOSIS — Z0000 Encounter for general adult medical examination without abnormal findings: Secondary | ICD-10-CM | POA: Insufficient documentation

## 2010-12-27 LAB — CBC WITH DIFFERENTIAL/PLATELET
Basophils Absolute: 0 10*3/uL (ref 0.0–0.1)
Basophils Relative: 0.6 % (ref 0.0–3.0)
Eosinophils Absolute: 0.2 10*3/uL (ref 0.0–0.7)
Eosinophils Relative: 3.5 % (ref 0.0–5.0)
HCT: 43.8 % (ref 36.0–46.0)
Hemoglobin: 14.8 g/dL (ref 12.0–15.0)
Lymphocytes Relative: 32.7 % (ref 12.0–46.0)
Lymphs Abs: 1.7 10*3/uL (ref 0.7–4.0)
MCHC: 33.9 g/dL (ref 30.0–36.0)
MCV: 99.6 fl (ref 78.0–100.0)
Monocytes Absolute: 0.3 10*3/uL (ref 0.1–1.0)
Monocytes Relative: 6.1 % (ref 3.0–12.0)
Neutro Abs: 2.9 10*3/uL (ref 1.4–7.7)
Neutrophils Relative %: 57.1 % (ref 43.0–77.0)
Platelets: 337 10*3/uL (ref 150.0–400.0)
RBC: 4.39 Mil/uL (ref 3.87–5.11)
RDW: 12.4 % (ref 11.5–14.6)
WBC: 5.1 10*3/uL (ref 4.5–10.5)

## 2010-12-27 LAB — BASIC METABOLIC PANEL
BUN: 11 mg/dL (ref 6–23)
CO2: 26 mEq/L (ref 19–32)
Calcium: 9.7 mg/dL (ref 8.4–10.5)
Chloride: 105 mEq/L (ref 96–112)
Creatinine, Ser: 0.7 mg/dL (ref 0.4–1.2)
GFR: 103.53 mL/min (ref >60.00)
Glucose, Bld: 87 mg/dL (ref 70–99)
Potassium: 4.9 mEq/L (ref 3.5–5.1)
Sodium: 140 mEq/L (ref 135–145)

## 2010-12-27 LAB — TSH: TSH: 1.09 u[IU]/mL (ref 0.35–5.50)

## 2010-12-27 LAB — HEPATIC FUNCTION PANEL
ALT: 18 U/L (ref 0–35)
AST: 25 U/L (ref 0–37)
Albumin: 4.1 g/dL (ref 3.5–5.2)
Alkaline Phosphatase: 58 U/L (ref 39–117)
Bilirubin, Direct: 0.2 mg/dL (ref 0.0–0.3)
Total Bilirubin: 1.2 mg/dL (ref 0.3–1.2)
Total Protein: 7.3 g/dL (ref 6.0–8.3)

## 2010-12-27 LAB — LIPID PANEL
Cholesterol: 130 mg/dL (ref 0–200)
HDL: 52.8 mg/dL (ref >39.00)
LDL Cholesterol: 60 mg/dL (ref 0–99)
Total CHOL/HDL Ratio: 2
Triglycerides: 86 mg/dL (ref 0.0–149.0)
VLDL: 17.2 mg/dL (ref 0.0–40.0)

## 2010-12-27 NOTE — Patient Instructions (Signed)
Follow up in 1 year or as needed Keep up the good work!  You look great! We'll notify you of your lab results Call with any questions or concerns Welcome!  We're glad to have you!! 

## 2010-12-27 NOTE — Assessment & Plan Note (Signed)
Pt's PE WNL.  UTD on GYN.  Check labs.  Anticipatory guidance provided.  

## 2010-12-27 NOTE — Progress Notes (Signed)
  Subjective:    Patient ID: Karen Booker, female    DOB: [birth year], 20 y.o.   MRN: [id number]  HPI Transferring from [facility]  No concerns about health.  GYN- [facility] , UTD on pap.  No medical problems, feeling well.   Review of Systems Patient reports no vision/ hearing changes, adenopathy,fever, weight change,  persistant/recurrent hoarseness , swallowing issues, chest pain, palpitations, edema, persistant/recurrent cough, hemoptysis, dyspnea (rest/exertional/paroxysmal nocturnal), gastrointestinal bleeding (melena, rectal bleeding), abdominal pain, significant heartburn, bowel changes, GU symptoms (dysuria, hematuria, incontinence), Gyn symptoms (abnormal  bleeding, pain),  syncope, focal weakness, memory loss, numbness & tingling, skin/hair/nail changes, abnormal bruising or bleeding, anxiety, or depression.     Objective:   Physical Exam General Appearance:    Alert, cooperative, no distress, appears stated age  Head:    Normocephalic, without obvious abnormality, atraumatic  Eyes:    PERRL, conjunctiva/corneas clear, EOM's intact, fundi    benign, both eyes  Ears:    Normal TM's and external ear canals, both ears  Nose:   Nares normal, septum midline, mucosa normal, no drainage    or sinus tenderness  Throat:   Lips, mucosa, and tongue normal; teeth and gums normal  Neck:   Supple, symmetrical, trachea midline, no adenopathy;    Thyroid: no enlargement/tenderness/nodules  Back:     Symmetric, no curvature, ROM normal, no CVA tenderness  Lungs:     Clear to auscultation bilaterally, respirations unlabored  Chest Wall:    No tenderness or deformity   Heart:    Regular rate and rhythm, S1 and S2 normal, no murmur, rub   or gallop  Breast Exam:    Deferred to GYN  Abdomen:     Soft, non-tender, bowel sounds active all four quadrants,    no masses, no organomegaly  Genitalia:    Deferred to GYN  Rectal:    Extremities:   Extremities normal, atraumatic, no  cyanosis or edema  Pulses:   2+ and symmetric all extremities  Skin:   Skin color, texture, turgor normal, no rashes or lesions  Lymph nodes:   Cervical, supraclavicular, and axillary nodes normal  Neurologic:   CNII-XII intact, normal strength, sensation and reflexes    throughout          Assessment & Plan:

## 2010-12-29 ENCOUNTER — Encounter: Payer: Self-pay | Admitting: *Deleted

## 2010-12-29 LAB — VITAMIN D 1,25 DIHYDROXY
Vitamin D 1, 25 (OH)2 Total: 68 pg/mL (ref 18–72)
Vitamin D2 1, 25 (OH)2: <8 pg/mL
Vitamin D3 1, 25 (OH)2: 68 pg/mL

## 2011-04-09 ENCOUNTER — Ambulatory Visit: Payer: 59 | Admitting: Family Medicine

## 2011-04-09 ENCOUNTER — Ambulatory Visit (INDEPENDENT_AMBULATORY_CARE_PROVIDER_SITE_OTHER): Payer: 59 | Admitting: Internal Medicine

## 2011-04-09 VITALS — BP 92/70 | HR 123 | Temp 98.7°F | Wt 124.0 lb

## 2011-04-09 DIAGNOSIS — J329 Chronic sinusitis, unspecified: Secondary | ICD-10-CM

## 2011-04-09 MED ORDER — AZITHROMYCIN 250 MG PO TABS: ORAL_TABLET | ORAL | Status: AC

## 2011-04-09 NOTE — Patient Instructions (Signed)
Rest, fluids , tylenol For cough, take Mucinex DM twice a day as needed  Take the antibiotic as prescribed ----> zithromax Call if no better in few days Call anytime if the symptoms are severe Your pulse is high today, please call my nurse Lillia Abed next week and ask what is a good time to just came and get your pulse and BP checked

## 2011-04-09 NOTE — Progress Notes (Signed)
  Subjective:    Patient ID: Karen Booker, female    DOB: 20-Mar-1990, 21 y.o.   MRN: [id number]  HPI Acute visit Symptoms started a week ago with mild sore throat, then she developed sinus congestion and a runny nose. The last 2 days she got worse and developed severe sinus pain, left-sided and left teeth pain as well.  Past Medical History  Diagnosis Date  . Irritable bowel syndrome   . Other preterm infants, unspecified (weight)   . Scoliosis (and kyphoscoliosis), idiopathic   . Urinary tract infection, site not specified   . IBS (irritable bowel syndrome)   . UTI (urinary tract infection)    Past Surgical History  Procedure Date  . Wisdom tooth extraction 2010     Review of Systems Fever is now gone She had nausea and vomited once today, she thinks because amount of mucus that she had . No chest congestion or much cough at all.     Objective:   Physical Exam Alert oriented x3. HEENT: Ears normal, throat normal without redness or discharge, nose is slightly congested. Face symmetric, tender to palpation of the left maxillary area. Lungs clear to auscultation bilaterally Cardiovascular irregular rhythm without murmur       Assessment & Plan:  Sinusitis, Symptoms most likely due to sinusitis. She is allergic to cephalexin and nitrofurantoin, apparently she has taken amoxicillin before without problems but for now we'll try a Z-Pak. See instructions

## 2011-04-10 ENCOUNTER — Ambulatory Visit: Payer: 59 | Admitting: Family Medicine

## 2011-04-10 ENCOUNTER — Encounter: Payer: Self-pay | Admitting: Internal Medicine

## 2011-04-16 ENCOUNTER — Telehealth: Payer: Self-pay | Admitting: Family Medicine

## 2011-04-16 NOTE — Telephone Encounter (Signed)
Spoke with pt & she is coming in tomorrow at 8am to have her BP checked.

## 2011-04-16 NOTE — Telephone Encounter (Signed)
Patient called & stated Karen Booker told her that she needed to come back to see you for bp check  Can you call her with you schedule please PH# [phone]

## 2011-04-17 ENCOUNTER — Telehealth: Payer: Self-pay | Admitting: *Deleted

## 2011-04-17 NOTE — Telephone Encounter (Signed)
Pt came in this morning to have her BP & pulse checked.   BP 104/72 Pulse 96

## 2011-04-17 NOTE — Telephone Encounter (Signed)
Pulse better, if needed, call pt and let her know

## 2011-04-19 NOTE — Telephone Encounter (Signed)
Notified pt. 

## 2011-05-28 ENCOUNTER — Encounter: Payer: Self-pay | Admitting: Family Medicine

## 2011-05-28 ENCOUNTER — Ambulatory Visit (INDEPENDENT_AMBULATORY_CARE_PROVIDER_SITE_OTHER): Payer: 59 | Admitting: Family Medicine

## 2011-05-28 VITALS — BP 102/70 | HR 91 | Temp 98.7°F | Ht 65.75 in | Wt 125.4 lb

## 2011-05-28 DIAGNOSIS — B009 Herpesviral infection, unspecified: Secondary | ICD-10-CM | POA: Insufficient documentation

## 2011-05-28 NOTE — Assessment & Plan Note (Signed)
New.  Pt's boyfriend w/ active lesion.  Discussed that she has already been exposed and is likely infected.  Reviewed that this doesn't mean she will develop lesion.  Reviewed dx, tx, and disease course.  Reviewed supportive care and red flags that should prompt return.  Pt expressed understanding and is in agreement w/ plan.

## 2011-05-28 NOTE — Progress Notes (Signed)
  Subjective:    Patient ID: Karen Booker, female    DOB: 1990/05/26, 21 y.o.   MRN: [id number]  HPI Cold sore exposure- pt has never had cold sore, boyfriend recently developed 1st cold sore.  Pt has been kissing, sharing food/drink.  Concerned about exposure and what this means.   Review of Systems For ROS see HPI     Objective:   Physical Exam  Vitals reviewed. Constitutional: She appears well-developed and well-nourished. No distress.  HENT:  Head: Normocephalic and atraumatic.  Mouth/Throat: Oropharynx is clear and moist.       No current lesions          Assessment & Plan:

## 2011-05-28 NOTE — Patient Instructions (Signed)
Fever Blisters, Herpes Simplex Herpes simplex is a virus. This virus causes fever blisters or cold sores. Fever blisters are small sores on the lips, gums, or roof of the mouth. People often get infected with this herpes virus but do not have any symptoms. The blisters may break out when a person is:  Tired.   Under stress.   Suffering from another infection (such as a cold).   Exposed to sunlight.  The blisters usually heal within 1 week. The virus can be easily passed to other people and to other parts of the body, such as the eyes and sex organs. CAUSES  A virus, herpes simplex, is the cause of fever blisters. This virus can be passed (transmitted) from person to person and is therefore contagious. There are 2 types of herpes simplex virus. Type 1 usually causes oral herpes or fever blisters. Type 2 usually causes genital herpes. Both viruses do have the potential to cause oral and genital infections. However, the type 1 virus causes more than 90% of recurrent fever blister outbreaks.  Herpes simplex virus is highly contagious when fever blisters are present. Close contact, including kissing, can spread the virus. Children often become infected by contact with others who have fever blisters. A child can spread the virus by rubbing the cold sore and touching other children or when other children touch clothing, wipes, or toys contaminated by an infected child with the virus. In adults, about 10% of oral herpes infections are from oral-genital sex with a person who has active genital herpes (type 2).  Type 1 herpes infection is very common, eventually occurring in up to 8 out of 10 otherwise healthy people. Most people become infected before they are 23 years old. The virus usually infects the lips, throat, or mouth. Initial infection in children can be extensive with many lesions throughout the mouth. In adults, the first infection may cause no symptoms. Some adults may develop many fluid-filled  blisters inside and outside the mouth 3 to 5 days after they are initially infected but severe infection is uncommon. Fever, swollen neck glands, and general aches may occur but this is also uncommon. The blisters tend to come together and then collapse. When on the lip, a yellowish crust forms over the sores. Healing of the area without scarring typically occurs within 2 weeks. Once a person is infected, the herpes virus permanently remains alive in the body within a nerve near the cheekbone. It then stays inactive at this site, only to sometimes travel down the nerve to the skin. This causes a recurrence of fever blisters. Recurrent blisters usually break out at the outside edge of the lip or edge of the nostril. Recurrent fever blisters may occasionally occur on the chin, cheeks, or inside the mouth. Recurrent fever blister attacks are usually not as painful and not as numerous as the first infection. Recurrences are less frequent after age 35. Many people who have recurring fever blisters feel itching, tingling, or burning at the lip border. This can occur hours or a couple days before the blister appears.  Factors which weaken the body's immune system may trigger an outbreak or recurrence of herpes. These include some drugs (such as steroids), emotional stress, fever, illness, sleep deprivation, and other injuries. Sunlight may also trigger an outbreak. Many women have recurrences only during their menstrual period.  TREATMENT There is no cure for fever blisters. There is no vaccine for herpes simplex virus.  Certain medicines can relieve some of the pain   and discomfort of the sores or promote more rapid healing. These include ointments that numb the blisters and medicines that control bacterial infections (antibiotics). A number of drugs active against herpes viruses (antivirals), either applied locally as a gel or cream, or taken in pill form, may promote healing by keeping the virus from multiplying  and infecting more local tissue.   Keep fever blisters clean and dry. This helps to prevent bacterial invasion of the virally infected tissues.   Eat a soft, bland diet to avoid irritating the sores.   Be careful not to touch the sores and spread the virus to new sites, such as:   Other areas of the face.   Eyes.   Genitals.   Make sure you do not infect others. Avoid kissing people when a fever blister is present. Avoid touching the sores and then touching others.   Sunscreen on the lips can prevent recurrences if outbreaks are triggered by sunlight. The sunscreen should be put on before going outside and reapplied often while in the sun.   Avoid stress if this seems to cause outbreaks.  HOME CARE INSTRUCTIONS   Only take over-the-counter or prescription medicines for pain, discomfort, or fever as directed by your caregiver. Do not use aspirin.   Do not touch the blisters or pick the scabs. Wash your hands often. Do not touch your eyes without washing your hands first.   Avoid close contact with other people, especially kissing, until blisters heal.   Hot, cold, or salty foods may hurt your mouth. Use a straw to drink. Eating a well-balanced diet will help healing.  SEEK MEDICAL CARE IF:   Your eye feels irritated, painful, or you feel like you have something in your eye.   You develop a fever, feel achy, or see pus instead of clear fluid in the sores. These are signs of a bacterial infection.   You get blisters on your genitals.   You develop new, unexplained symptoms.  MAKE SURE YOU:   Understand these instructions.   Will watch your condition.   Will get help right away if you are not doing well or get worse.  Document Released: 01/29/2005 Document Revised: 01/18/2011 Document Reviewed: 06/05/2007 Exit[employer] Patient Information 2012 [employer]. 

## 2011-10-09 ENCOUNTER — Ambulatory Visit (INDEPENDENT_AMBULATORY_CARE_PROVIDER_SITE_OTHER): Payer: 59 | Admitting: Family Medicine

## 2011-10-09 ENCOUNTER — Encounter: Payer: Self-pay | Admitting: Family Medicine

## 2011-10-09 VITALS — BP 108/68 | HR 90 | Temp 98.0°F | Ht 65.75 in | Wt 128.6 lb

## 2011-10-09 DIAGNOSIS — R300 Dysuria: Secondary | ICD-10-CM

## 2011-10-09 DIAGNOSIS — R319 Hematuria, unspecified: Secondary | ICD-10-CM

## 2011-10-09 LAB — POCT URINALYSIS DIPSTICK
Bilirubin, UA: NEGATIVE
Glucose, UA: NEGATIVE
Ketones, UA: NEGATIVE
Leukocytes, UA: NEGATIVE
Nitrite, UA: NEGATIVE
Protein, UA: NEGATIVE
Spec Grav, UA: 1.01
Urobilinogen, UA: 0.2
pH, UA: 7

## 2011-10-09 MED ORDER — CIPROFLOXACIN HCL 500 MG PO TABS: 500.0000 mg | ORAL_TABLET | Freq: Two times a day (BID) | ORAL | Status: AC

## 2011-10-09 NOTE — Patient Instructions (Addendum)
We'll notify you of your labs and start antibiotics as needed Drink plenty of fluids Check out [email or web address] to determine if you need any vaccines or meds prior to your trip Take the Cipro w/ you just in case! Call with any questions or concerns Enjoy your labor day!!

## 2011-10-09 NOTE — Progress Notes (Signed)
  Subjective:    Patient ID: Karen Booker, female    DOB: 05/17/1990, 21 y.o.   MRN: [id number]  HPI ? UTI- sxs started last Friday w/ dysuria.  No increased frequency.  No hesitancy.  No urgency.  No hematuria.  Urine was not cloudy which is typical for her w/ UTI.  No fevers, back pain.  Hx of UTI.  Pain is improving but still present.  Last week had done a lot of stationary bike riding and thinks this may be the cause   Review of Systems For ROS see HPI     Objective:   Physical Exam  Vitals reviewed. Constitutional: She appears well-developed and well-nourished. No distress.  Abdominal: Soft. She exhibits no distension. There is no tenderness (no suprapubic or CVA tenderness).          Assessment & Plan:

## 2011-10-09 NOTE — Assessment & Plan Note (Signed)
New.  Improving w/out intervention.  Pt elects to hold off on abx until cx results available.  Will call and start meds prn.  Pt expressed understanding and is in agreement w/ plan.

## 2011-10-11 LAB — CULTURE, URINE COMPREHENSIVE: Colony Count: >=100000

## 2011-10-11 MED ORDER — SULFAMETHOXAZOLE-TMP DS 800-160 MG PO TABS: 1.0000 | ORAL_TABLET | Freq: Two times a day (BID) | ORAL | Status: AC

## 2011-10-11 NOTE — Addendum Note (Signed)
Addended by: Derry Lory A on: 10/11/2011 04:47 PM   Modules accepted: Orders

## 2011-10-12 ENCOUNTER — Telehealth: Payer: Self-pay | Admitting: *Deleted

## 2011-10-12 NOTE — Telephone Encounter (Signed)
Pt called back to advise that she is feeling much better now and wants to know if she needs to finish taking her ABT, noted started taking yesterday, advised per verbal instructions from MD Tabori to continue to take and complete ABT, pt understood and still noted that feels better, advised her culture was positive for infection and she can start to feel better with the ABT, pt understood and will continue to take

## 2011-12-11 ENCOUNTER — Encounter: Payer: Self-pay | Admitting: Family Medicine

## 2011-12-11 ENCOUNTER — Ambulatory Visit (INDEPENDENT_AMBULATORY_CARE_PROVIDER_SITE_OTHER): Payer: 59 | Admitting: Family Medicine

## 2011-12-11 VITALS — BP 100/62 | HR 93 | Temp 98.3°F | Wt 125.4 lb

## 2011-12-11 DIAGNOSIS — K5289 Other specified noninfective gastroenteritis and colitis: Secondary | ICD-10-CM

## 2011-12-11 DIAGNOSIS — K529 Noninfective gastroenteritis and colitis, unspecified: Secondary | ICD-10-CM | POA: Insufficient documentation

## 2011-12-11 MED ORDER — HYOSCYAMINE SULFATE 0.125 MG SL SUBL: 0.1250 mg | SUBLINGUAL_TABLET | SUBLINGUAL | Status: DC | PRN

## 2011-12-11 NOTE — Progress Notes (Signed)
  Subjective:     Karen Booker is a 21 y.o. female who presents for evaluation of nonbilious vomiting 4 times per day, diarrhea 6 times per day and fever. Symptoms have been present for 3 days. Patient denies acholic stools, blood in stool, constipation, dark urine, dysuria, heartburn, hematemesis, hematuria and melena. Patient's oral intake has been normal. Patient's urine output has been adequate. Other contacts with similar symptoms include: none. Patient denies recent travel history. Patient has not had recent ingestion of possible contaminated food, toxic plants, or inappropriate medications/poisons.   The following portions of the patient's history were reviewed and updated as appropriate: allergies, current medications, past family history, past medical history, past social history, past surgical history and problem list.  Review of Systems Pertinent items are noted in HPI.    Objective:     BP 100/62  Pulse 93  Temp 98.3 F (36.8 C) (Oral)  Wt 125 lb 6.4 oz (56.881 kg)  SpO2 98% General appearance: alert, cooperative and no distress Throat: lips, mucosa, and tongue normal; teeth and gums normal Abdomen: soft, non-tender; bowel sounds normal; no masses,  no organomegaly    Assessment:    Acute Gastroenteritis    Plan:    1. Discussed oral rehydration, reintroduction of solid foods, signs of dehydration. 2. Return or go to emergency department if worsening symptoms, blood or bile, signs of dehydration, diarrhea lasting longer than 5 days or any new concerns. 3. Follow up in few days if no better or sooner.

## 2011-12-11 NOTE — Patient Instructions (Signed)
Viral Gastroenteritis Viral gastroenteritis is also known as stomach flu. This condition affects the stomach and intestinal tract. It can cause sudden diarrhea and vomiting. The illness typically lasts 3 to 8 days. Most people develop an immune response that eventually gets rid of the virus. While this natural response develops, the virus can make you quite ill. CAUSES  Many different viruses can cause gastroenteritis, such as rotavirus or noroviruses. You can catch one of these viruses by consuming contaminated food or water. You may also catch a virus by sharing utensils or other personal items with an infected person or by touching a contaminated surface. SYMPTOMS  The most common symptoms are diarrhea and vomiting. These problems can cause a severe loss of body fluids (dehydration) and a body salt (electrolyte) imbalance. Other symptoms may include:  Fever.  Headache.  Fatigue.  Abdominal pain. DIAGNOSIS  Your caregiver can usually diagnose viral gastroenteritis based on your symptoms and a physical exam. A stool sample may also be taken to test for the presence of viruses or other infections. TREATMENT  This illness typically goes away on its own. Treatments are aimed at rehydration. The most serious cases of viral gastroenteritis involve vomiting so severely that you are not able to keep fluids down. In these cases, fluids must be given through an intravenous line (IV). HOME CARE INSTRUCTIONS   Drink enough fluids to keep your urine clear or pale yellow. Drink small amounts of fluids frequently and increase the amounts as tolerated.  Ask your caregiver for specific rehydration instructions.  Avoid:  Foods high in sugar.  Alcohol.  Carbonated drinks.  Tobacco.  Juice.  Caffeine drinks.  Extremely hot or cold fluids.  Fatty, greasy foods.  Too much intake of anything at one time.  Dairy products until 24 to 48 hours after diarrhea stops.  You may consume probiotics.  Probiotics are active cultures of beneficial bacteria. They may lessen the amount and number of diarrheal stools in adults. Probiotics can be found in yogurt with active cultures and in supplements.  Wash your hands well to avoid spreading the virus.  Only take over-the-counter or prescription medicines for pain, discomfort, or fever as directed by your caregiver. Do not give aspirin to children. Antidiarrheal medicines are not recommended.  Ask your caregiver if you should continue to take your regular prescribed and over-the-counter medicines.  Keep all follow-up appointments as directed by your caregiver. SEEK IMMEDIATE MEDICAL CARE IF:   You are unable to keep fluids down.  You do not urinate at least once every 6 to 8 hours.  You develop shortness of breath.  You notice blood in your stool or vomit. This may look like coffee grounds.  You have abdominal pain that increases or is concentrated in one small area (localized).  You have persistent vomiting or diarrhea.  You have a fever.  The patient is a child younger than 3 months, and he or she has a fever.  The patient is a child older than 3 months, and he or she has a fever and persistent symptoms.  The patient is a child older than 3 months, and he or she has a fever and symptoms suddenly get worse.  The patient is a baby, and he or she has no tears when crying. MAKE SURE YOU:   Understand these instructions.  Will watch your condition.  Will get help right away if you are not doing well or get worse. Document Released: 01/29/2005 Document Revised: 04/23/2011 Document Reviewed: 11/15/2010   [employer] Patient Information 2013 [employer].  

## 2012-10-22 ENCOUNTER — Encounter: Payer: Self-pay | Admitting: Family Medicine

## 2012-10-22 ENCOUNTER — Ambulatory Visit (INDEPENDENT_AMBULATORY_CARE_PROVIDER_SITE_OTHER): Payer: 59 | Admitting: Family Medicine

## 2012-10-22 VITALS — BP 100/72 | HR 100 | Temp 98.4°F | Ht 65.75 in | Wt 126.6 lb

## 2012-10-22 DIAGNOSIS — F341 Dysthymic disorder: Secondary | ICD-10-CM

## 2012-10-22 DIAGNOSIS — F418 Other specified anxiety disorders: Secondary | ICD-10-CM | POA: Insufficient documentation

## 2012-10-22 MED ORDER — FLUOXETINE HCL 20 MG PO TABS: 20.0000 mg | ORAL_TABLET | Freq: Every day | ORAL | Status: DC
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
## 2012-10-22 NOTE — Patient Instructions (Addendum)
Follow up in 3-4 weeks to recheck mood Start the Prozac daily If you have increased thoughts of suicide- please call! Hang in there!  This is not your fault!!

## 2012-10-22 NOTE — Assessment & Plan Note (Signed)
New.  Severe.  Pt admits to suicidal thoughts but able to contract for safety.  Seeing therapist twice weekly.  Start Prozac as pt's sister has done well on this.

## 2012-11-20 ENCOUNTER — Ambulatory Visit (INDEPENDENT_AMBULATORY_CARE_PROVIDER_SITE_OTHER): Payer: 59 | Admitting: Family Medicine

## 2012-11-20 ENCOUNTER — Encounter: Payer: Self-pay | Admitting: Family Medicine

## 2012-11-20 VITALS — BP 120/72 | HR 92 | Temp 98.6°F | Resp 16 | Wt 123.0 lb

## 2012-11-20 DIAGNOSIS — F341 Dysthymic disorder: Secondary | ICD-10-CM

## 2012-11-20 DIAGNOSIS — F418 Other specified anxiety disorders: Secondary | ICD-10-CM

## 2012-11-20 MED ORDER — FLUOXETINE HCL 40 MG PO CAPS: 40.0000 mg | ORAL_CAPSULE | Freq: Every day | ORAL | Status: DC

## 2012-11-20 NOTE — Patient Instructions (Signed)
Increase the Prozac to 40mg  daily- 2 of what you have at home and 1 of the new prescription Continue counseling- both individually and as a couple Call with any questions or concerns- particularly if no improvement, worsening side effects, or anything else Hang in there!!!

## 2012-11-20 NOTE — Assessment & Plan Note (Signed)
Slightly improved.  Pt still very confused about her relationship.  Will increase Prozac to 40mg  and continue to monitor.  Pt expressed understanding and is in agreement w/ plan.

## 2012-11-20 NOTE — Progress Notes (Signed)
  Subjective:    Patient ID: Karen Booker, female    DOB: 30-Apr-1990, 22 y.o.   MRN: [id number]  HPI Depression- started on Prozac at last visit.  Still in counseling weekly and this is very helpful.  'i feel less like killing myself'.  Some decreased appetite and mild nausea but this could either be med or stress related.  Is living at home w/ parents and feeling safe.  Has not ended relationship.  Is thinking about starting couples counseling.   Review of Systems For ROS see HPI     Objective:   Physical Exam  Vitals reviewed. Constitutional: She is oriented to person, place, and time. She appears well-developed and well-nourished. No distress.  Neurological: She is alert and oriented to person, place, and time.  Psychiatric: Her behavior is normal. Thought content normal.  anxious          Assessment & Plan:

## 2013-10-30 ENCOUNTER — Encounter: Payer: Self-pay | Admitting: Family Medicine

## 2013-10-30 ENCOUNTER — Ambulatory Visit (INDEPENDENT_AMBULATORY_CARE_PROVIDER_SITE_OTHER): Payer: 59 | Admitting: Family Medicine

## 2013-10-30 VITALS — BP 110/80 | HR 97 | Temp 98.1°F | Resp 16 | Wt 131.2 lb

## 2013-10-30 DIAGNOSIS — F341 Dysthymic disorder: Secondary | ICD-10-CM

## 2013-10-30 DIAGNOSIS — F418 Other specified anxiety disorders: Secondary | ICD-10-CM

## 2013-10-30 DIAGNOSIS — M62838 Other muscle spasm: Secondary | ICD-10-CM | POA: Insufficient documentation

## 2013-10-30 MED ORDER — MELOXICAM 15 MG PO TABS: 15.0000 mg | ORAL_TABLET | Freq: Every day | ORAL | Status: DC

## 2013-10-30 MED ORDER — CYCLOBENZAPRINE HCL 10 MG PO TABS: 10.0000 mg | ORAL_TABLET | Freq: Three times a day (TID) | ORAL | Status: DC | PRN

## 2013-10-30 MED ORDER — ALPRAZOLAM 0.5 MG PO TABS: 0.5000 mg | ORAL_TABLET | Freq: Two times a day (BID) | ORAL | Status: DC | PRN

## 2013-10-30 NOTE — Assessment & Plan Note (Signed)
New.  Suspect this is cause of pt's neck pain and tension HAs.  Start scheduled NSAIDs.  Flexeril prn.  Reviewed supportive care and red flags that should prompt return.  Pt expressed understanding and is in agreement w/ plan.

## 2013-10-30 NOTE — Assessment & Plan Note (Signed)
Deteriorated.  Pt is having high anxiety due to boyfriends bipolar condition.  Having panic attacks.  Will start low dose alprazolam as needed.  Will follow closely.

## 2013-10-30 NOTE — Progress Notes (Signed)
Pre visit review using our clinic review tool, if applicable. No additional management support is needed unless otherwise documented below in the visit note. 

## 2013-10-30 NOTE — Progress Notes (Signed)
   Subjective:    Patient ID: Karen Booker, female    DOB: 12-11-1990, 23 y.o.   MRN: [id number]  HPI Neck pain- pt reports sxs 'for the past couple of weeks'.  Intermittent.  Upper R sided neck pain, HAs, sensitivity to light.  Pt reports 'significant amount' of stress.  Some tightness in neck.  Sleeping 'ok not great'.  No numbness radiating into arms or shoulders  Anxiety- pt is following weekly w/ therapist.  Pt's fiance is Bipolar and this is very hard for her.  On Prozac daily.  Still having panic attacks.  Tearful when talking about it in office.  Review of Systems For ROS see HPI     Objective:   Physical Exam  Vitals reviewed. Constitutional: She is oriented to person, place, and time. She appears well-developed and well-nourished.  HENT:  Head: Normocephalic and atraumatic.  Eyes: Conjunctivae and EOM are normal. Pupils are equal, round, and reactive to light.  Neck: Normal range of motion. Neck supple.  Bilateral trap spasm  Cardiovascular: Intact distal pulses.   Neurological: She is alert and oriented to person, place, and time. She has normal reflexes. No cranial nerve deficit. Coordination normal.  Skin: Skin is warm and dry.  Psychiatric:  Tearful, anxious          Assessment & Plan:

## 2013-10-30 NOTE — Patient Instructions (Signed)
Follow up as needed Start the Meloxicam daily w/ food for inflammation x7-10 days Use the Cyclobenzaprine as needed for spasm- will cause drowsiness and best used at night HEAT! Use the Alprazolam as needed for panicked moments- start w/ 1/2 tab and increase to 1 tab as needed Call with any questions or concerns Hang in there!!!

## 2014-02-12 ENCOUNTER — Emergency Department (HOSPITAL_COMMUNITY)
Admission: EM | Admit: 2014-02-12 | Discharge: 2014-02-12 | Disposition: A | Discharge status: Home / Self Care | Payer: 59 | Source: Home / Self Care | Attending: Emergency Medicine | Admitting: Emergency Medicine

## 2014-02-12 ENCOUNTER — Encounter (HOSPITAL_COMMUNITY): Payer: Self-pay | Admitting: Emergency Medicine

## 2014-02-12 DIAGNOSIS — M436 Torticollis: Secondary | ICD-10-CM

## 2014-02-12 MED ORDER — OXYCODONE-ACETAMINOPHEN 5-325 MG PO TABS: ORAL_TABLET | ORAL | Status: DC

## 2014-02-12 MED ORDER — HYDROCODONE-ACETAMINOPHEN 5-325 MG PO TABS
ORAL_TABLET | ORAL | Status: AC
  Filled 2014-02-12: qty 2

## 2014-02-12 MED ORDER — DICLOFENAC SODIUM 75 MG PO TBEC: 75.0000 mg | DELAYED_RELEASE_TABLET | Freq: Two times a day (BID) | ORAL | Status: DC

## 2014-02-12 MED ORDER — TIZANIDINE HCL 4 MG PO CAPS: 4.0000 mg | ORAL_CAPSULE | Freq: Three times a day (TID) | ORAL | Status: DC

## 2014-02-12 MED ORDER — HYDROCODONE-ACETAMINOPHEN 5-325 MG PO TABS
2.0000 | ORAL_TABLET | Freq: Once | ORAL | Status: AC
  Administered 2014-02-12: 2 via ORAL

## 2014-02-12 MED ORDER — PREDNISONE 20 MG PO TABS: 20.0000 mg | ORAL_TABLET | Freq: Two times a day (BID) | ORAL | Status: DC

## 2014-02-12 NOTE — ED Provider Notes (Signed)
Chief Complaint   Neck Pain   History of Present Illness   Karen Booker is a 24 year old female who has had a two-day history of right-sided neck pain. She denies any injury. This began gradually during the day. It's radiates up towards the base of the skull and down towards the shoulder but not of the arm. It's worse with movement. She's had a recent URI with slight sore throat and she's had some tingles in her hand but no consistent numbness or weakness. She denies any fever, chills, headache, cough, shortness of breath, or chest pain.  Review of Systems   Other than noted above, the patient denies any of the following symptoms: Constitutional:  No fever or chills. Neck:  No swelling, or adenopathy.   Cardiac:  No chest pain, tightness, or pressure. Respiratory:  No cough or dyspnea. M-S:  No joint pain, muscle pain, or back problems. Neuro:  No headache, muscle weakness, or paresthesias.  [place]   Past medical history, family history, social history, meds, and allergies were reviewed.    Physical Examination    Vital signs:  BP 146/98 mmHg  Pulse 94  Temp(Src) 98.6 F (37 C) (Oral)  Resp 16  SpO2 97%  LMP 01/29/2014 General:  Alert, oriented and in no distress. ENT:  Pharynx clear, no oral lesions. Neck:  There is pain to palpation over the right trapezius ridge. No swelling or rash in that area. No erythema. Neck has a full range of motion with pain on extension but not flexion. She is able to touch her chin to her chest. She has pain on lateral bending to both size and rotation to both sides.   Lungs:  No respiratory distress.  Breath sounds clear and equal bilaterally.  No wheezes, rales or rhonchi. Heart:  Regular rhythm.  No gallops, murmers, or rubs. Ext:  No upper extremity edema, pulses full.  Full ROM of joints with no joint or muscle pain to palpation. Neuro:  Alert and oriented times 3.  No focal muscle weakness.  DTRs symmetric.  Sensation intact to light  touch. Skin: Clear, warm and dry.  No rash.  Good capillary refill.  Course in [facility] [place]   The following medications were given:  Medications  HYDROcodone-acetaminophen (NORCO/VICODIN) 5-325 MG per tablet 2 tablet (2 tablets Oral Given 02/12/14 1948)   Assessment   The encounter diagnosis was Torticollis.  Differential diagnosis also includes degenerative disc disease, cervical radiculopathy, shingles, or arthritis. If no improvement or weeks suggested she follow-up with neurosurgery.  Plan    1.  Meds:  The following meds were prescribed:   Discharge Medication List as of 02/12/2014  7:43 PM    START taking these medications   Details  diclofenac (VOLTAREN) 75 MG EC tablet Take 1 tablet (75 mg total) by mouth 2 (two) times daily., Starting 02/12/2014, Until Discontinued, Normal    oxyCODONE-acetaminophen (PERCOCET) 5-325 MG per tablet 1 to 2 tablets every 6 hours as needed for pain., Print    predniSONE (DELTASONE) 20 MG tablet Take 1 tablet (20 mg total) by mouth 2 (two) times daily., Starting 02/12/2014, Until Discontinued, Normal    tiZANidine (ZANAFLEX) 4 MG capsule Take 1 capsule (4 mg total) by mouth 3 (three) times daily., Starting 02/12/2014, Until Discontinued, Normal        2.  Patient Education/Counseling:  The patient was given appropriate handouts, self care instructions, and instructed in pain control.  Given exercises to do twice daily followed  by moist heat.    3.  Follow up:  The patient was told to follow up with primary care physician or neurosurgeon if no better in one week, or sooner if becoming worse in any way, and given some red flag symptoms such as worsening pain or new neurological symptoms which would prompt immediate return.       Harden Mo, MD 02/12/14 2003

## 2014-02-12 NOTE — ED Notes (Signed)
C/o right side neck pain onset 2 days; getting worse Pain increases when she turns to the left or down Denies inj/trauma, f/n/v/d, chills Taking flexeril and meloxicam w/no relief Recent travel to [place], no signs of acute distress.

## 2014-02-12 NOTE — Discharge Instructions (Signed)
Torticollis, Acute You have suddenly (acutely) developed a twisted neck (torticollis). This is usually a self-limited condition. CAUSES  Acute torticollis may be caused by malposition, trauma or infection. Most commonly, acute torticollis is caused by sleeping in an awkward position. Torticollis may also be caused by the flexion, extension or twisting of the neck muscles beyond their normal position. Sometimes, the exact cause may not be known. SYMPTOMS  Usually, there is pain and limited movement of the neck. Your neck may twist to one side. DIAGNOSIS  The diagnosis is often made by physical examination. X-rays, CT scans or MRIs may be done if there is a history of trauma or concern of infection. TREATMENT  For a common, stiff neck that develops during sleep, treatment is focused on relaxing the contracted neck muscle. Medications (including shots) may be used to treat the problem. Most cases resolve in several days. Torticollis usually responds to conservative physical therapy. If left untreated, the shortened and spastic neck muscle can cause deformities in the face and neck. Rarely, surgery is required. HOME CARE INSTRUCTIONS   Use over-the-counter and prescription medications as directed by your caregiver.  Do stretching exercises and massage the neck as directed by your caregiver.  Follow up with physical therapy if needed and as directed by your caregiver. SEEK IMMEDIATE MEDICAL CARE IF:   You develop difficulty breathing or noisy breathing (stridor).  You drool, develop trouble swallowing or have pain with swallowing.  You develop numbness or weakness in the hands or feet.  You have changes in speech or vision.  You have problems with urination or bowel movements.  You have difficulty walking.  You have a fever.  You have increased pain. MAKE SURE YOU:   Understand these instructions.  Will watch your condition.  Will get help right away if you are not doing well or  get worse. Document Released: 01/27/2000 Document Revised: 04/23/2011 Document Reviewed: 03/09/2009 [facility] Patient Information 2015 [place], [place]. This information is not intended to replace advice given to you by your health care provider. Make sure you discuss any questions you have with your health care provider.  TREATMENT  Treatment initially involves the use of ice and medication to help reduce pain and inflammation. It is also important to perform strengthening and stretching exercises and modify activities that worsen symptoms so the injury does not get worse. These exercises may be performed at home or with a therapist. For patients who experience severe symptoms, a soft padded collar may be recommended to be worn around the neck.  Improving your posture may help reduce symptoms. Posture improvement includes pulling your chin and abdomen in while sitting or standing. If you are sitting, sit in a firm chair with your buttocks against the back of the chair. While sleeping, try replacing your pillow with a small towel rolled to 2 inches in diameter, or use a cervical pillow. Poor sleeping positions delay healing.   MEDICATION   If pain medication is necessary, nonsteroidal anti-inflammatory medications, such as aspirin and ibuprofen, or other minor pain relievers, such as acetaminophen, are often recommended.  Do not take pain medication for 7 days before surgery.  Prescription pain relievers may be given if deemed necessary by your caregiver. Use only as directed and only as much as you need.  HEAT AND COLD:   Cold treatment (icing) relieves pain and reduces inflammation. Cold treatment should be applied for 10 to 15 minutes every 2 to 3 hours for inflammation and pain and immediately after  any activity that aggravates your symptoms. Use ice packs or an ice massage.  Heat treatment may be used prior to performing the stretching and strengthening activities prescribed by your caregiver,  physical therapist, or athletic trainer. Use a heat pack or a warm soak.  SEEK MEDICAL CARE IF:   Symptoms get worse or do not improve in 2 weeks despite treatment.  New, unexplained symptoms develop (drugs used in treatment may produce side effects).  EXERCISES RANGE OF MOTION (ROM) AND STRETCHING EXERCISES - Cervical Strain and Sprain These exercises may help you when beginning to rehabilitate your injury. In order to successfully resolve your symptoms, you must improve your posture. These exercises are designed to help reduce the forward-head and rounded-shoulder posture which contributes to this condition. Your symptoms may resolve with or without further involvement from your physician, physical therapist or athletic trainer. While completing these exercises, remember:   Restoring tissue flexibility helps normal motion to return to the joints. This allows healthier, less painful movement and activity.  An effective stretch should be held for at least 20 seconds, although you may need to begin with shorter hold times for comfort.  A stretch should never be painful. You should only feel a gentle lengthening or release in the stretched tissue.  STRETCH- Axial Extensors  Lie on your back on the floor. You may bend your knees for comfort. Place a rolled up hand towel or dish towel, about 2 inches in diameter, under the part of your head that makes contact with the floor.  Gently tuck your chin, as if trying to make a "double chin," until you feel a gentle stretch at the base of your head.  Hold _____10_____ seconds. Repeat _____10_____ times. Complete this exercise _____2_____ times per day.   STRETECH - Axial Extension   Stand or sit on a firm surface. Assume a good posture: chest up, shoulders drawn back, abdominal muscles slightly tense, knees unlocked (if standing) and feet hip width apart.  Slowly retract your chin so your head slides back and your chin slightly lowers.Continue to  look straight ahead.  You should feel a gentle stretch in the back of your head. Be certain not to feel an aggressive stretch since this can cause headaches later.  Hold for ____10______ seconds. Repeat _____10_____ times. Complete this exercise ____2______ times per day.  STRETCH  Cervical Side Bend   Stand or sit on a firm surface. Assume a good posture: chest up, shoulders drawn back, abdominal muscles slightly tense, knees unlocked (if standing) and feet hip width apart.  Without letting your nose or shoulders move, slowly tip your right / left ear to your shoulder until your feel a gentle stretch in the muscles on the opposite side of your neck.  Hold _____10_____ seconds. Repeat _____10_____ times. Complete this exercise _____2_____ times per day.  STRETCH  Cervical Rotators   Stand or sit on a firm surface. Assume a good posture: chest up, shoulders drawn back, abdominal muscles slightly tense, knees unlocked (if standing) and feet hip width apart.  Keeping your eyes level with the ground, slowly turn your head until you feel a gentle stretch along the back and opposite side of your neck.  Hold _____10_____ seconds. Repeat ____10______ times. Complete this exercise ____2______ times per day.  RANGE OF MOTION - Neck Circles   Stand or sit on a firm surface. Assume a good posture: chest up, shoulders drawn back, abdominal muscles slightly tense, knees unlocked (if standing) and feet hip width  apart.  Gently roll your head down and around from the back of one shoulder to the back of the other. The motion should never be forced or painful.  Repeat the motion 10-20 times, or until you feel the neck muscles relax and loosen. Repeat ____10______ times. Complete the exercise _____2_____ times per day.  STRENGTHENING EXERCISES - Cervical Strain and Sprain These exercises may help you when beginning to rehabilitate your injury. They may resolve your symptoms with or without further  involvement from your physician, physical therapist or athletic trainer. While completing these exercises, remember:   Muscles can gain both the endurance and the strength needed for everyday activities through controlled exercises.  Complete these exercises as instructed by your physician, physical therapist or athletic trainer. Progress the resistance and repetitions only as guided.  You may experience muscle soreness or fatigue, but the pain or discomfort you are trying to eliminate should never worsen during these exercises. If this pain does worsen, stop and make certain you are following the directions exactly. If the pain is still present after adjustments, discontinue the exercise until you can discuss the trouble with your clinician.  STRENGTH Cervical Flexors, Isometric  Face a wall, standing about 6 inches away. Place a small pillow, a ball about 6-8 inches in diameter, or a folded towel between your forehead and the wall.  Slightly tuck your chin and gently push your forehead into the soft object. Push only with mild to moderate intensity, building up tension gradually. Keep your jaw and forehead relaxed.  Hold 10 to 20 seconds. Keep your breathing relaxed.  Release the tension slowly. Relax your neck muscles completely before you start the next repetition. Repeat _____10_____ times. Complete this exercise _____2_____ times per day.  STRENGTH- Cervical Lateral Flexors, Isometric   Stand about 6 inches away from a wall. Place a small pillow, a ball about 6-8 inches in diameter, or a folded towel between the side of your head and the wall.  Slightly tuck your chin and gently tilt your head into the soft object. Push only with mild to moderate intensity, building up tension gradually. Keep your jaw and forehead relaxed.  Hold 10 to 20 seconds. Keep your breathing relaxed.  Release the tension slowly. Relax your neck muscles completely before you start the next repetition. Repeat  _____10_____ times. Complete this exercise ____2______ times per day.  STRENGTH  Cervical Extensors, Isometric   Stand about 6 inches away from a wall. Place a small pillow, a ball about 6-8 inches in diameter, or a folded towel between the back of your head and the wall.  Slightly tuck your chin and gently tilt your head back into the soft object. Push only with mild to moderate intensity, building up tension gradually. Keep your jaw and forehead relaxed.  Hold 10 to 20 seconds. Keep your breathing relaxed.  Release the tension slowly. Relax your neck muscles completely before you start the next repetition. Repeat _____10_____ times. Complete this exercise _____2_____ times per day.  POSTURE AND BODY MECHANICS CONSIDERATIONS - Cervical Strain and Sprain Keeping correct posture when sitting, standing or completing your activities will reduce the stress put on different body tissues, allowing injured tissues a chance to heal and limiting painful experiences. The following are general guidelines for improved posture. Your physician or physical therapist will provide you with any instructions specific to your needs. While reading these guidelines, remember:  The exercises prescribed by your provider will help you have the flexibility and strength to  maintain correct postures.  The correct posture provides the optimal environment for your joints to work. All of your joints have less wear and tear when properly supported by a spine with good posture. This means you will experience a healthier, less painful body.  Correct posture must be practiced with all of your activities, especially prolonged sitting and standing. Correct posture is as important when doing repetitive low-stress activities (typing) as it is when doing a single heavy-load activity (lifting). PROLONGED STANDING WHILE SLIGHTLY LEANING FORWARD When completing a task that requires you to lean forward while standing in one place for a  long time, place either foot up on a stationary 2-4 inch high object to help maintain the best posture. When both feet are on the ground, the low back tends to lose its slight inward curve. If this curve flattens (or becomes too large), then the back and your other joints will experience too much stress, fatigue more quickly and can cause pain.  RESTING POSITIONS Consider which positions are most painful for you when choosing a resting position. If you have pain with flexion-based activities (sitting, bending, stooping, squatting), choose a position that allows you to rest in a less flexed posture. You would want to avoid curling into a fetal position on your side. If your pain worsens with extension-based activities (prolonged standing, working overhead), avoid resting in an extended position such as sleeping on your stomach. Most people will find more comfort when they rest with their spine in a more neutral position, neither too rounded nor too arched. Lying on a non-sagging bed on your side with a pillow between your knees, or on your back with a pillow under your knees will often provide some relief. Keep in mind, being in any one position for a prolonged period of time, no matter how correct your posture, can still lead to stiffness. WALKING Walk with an upright posture. Your ears, shoulders and hips should all line-up. OFFICE WORK When working at a desk, create an environment that supports good, upright posture. Without extra support, muscles fatigue and lead to excessive strain on joints and other tissues. CHAIR:  A chair should be able to slide under your desk when your back makes contact with the back of the chair. This allows you to work closely.  The chair's height should allow your eyes to be level with the upper part of your monitor and your hands to be slightly lower than your elbows.  Body position:  Your feet should make contact with the floor. If this is not possible, use a foot  rest.  Keep your ears over your shoulders. This will reduce stress on your neck and low back. Document Released: 01/29/2005 Document Revised: 04/23/2011 Document Reviewed: 05/13/2008 [facility] Patient Information 2013 [place].

## 2014-04-09 ENCOUNTER — Encounter: Payer: Self-pay | Admitting: Family Medicine

## 2014-04-09 ENCOUNTER — Ambulatory Visit (INDEPENDENT_AMBULATORY_CARE_PROVIDER_SITE_OTHER): Payer: 59 | Admitting: Family Medicine

## 2014-04-09 VITALS — BP 123/82 | HR 109 | Temp 98.9°F | Resp 16 | Ht 65.0 in | Wt 127.0 lb

## 2014-04-09 DIAGNOSIS — R197 Diarrhea, unspecified: Secondary | ICD-10-CM

## 2014-04-09 NOTE — Progress Notes (Signed)
Pre visit review using our clinic review tool, if applicable. No additional management support is needed unless otherwise documented below in the visit note. 

## 2014-04-09 NOTE — Progress Notes (Signed)
   Subjective:    Patient ID: Karen Booker, female    DOB: 1991-02-07, 24 y.o.   MRN: [id number]  HPI Gastroenteritis- sxs started suddenly Monday AM w/ diarrhea.  Then vomited x1.  Then more diarrhea.  Has had diarrhea all week and then vomited again last night.  Diarrhea is watery.  No blood.  No known sick contacts.  No fevers.  + abd pain- cramping and aching, bloating.  Pt initially thought it was food poisoning after eating sushi but no one else became ill.  Started Pepto yesterday.  Hasn't taken immodium.   Review of Systems For ROS see HPI     Objective:   Physical Exam  Constitutional: She is oriented to person, place, and time. She appears well-developed and well-nourished. No distress.  HENT:  Head: Normocephalic and atraumatic.  MMM  Neck: Neck supple.  Cardiovascular: Normal rate, regular rhythm and intact distal pulses.   Pulmonary/Chest: Effort normal and breath sounds normal. No respiratory distress. She has no wheezes. She has no rales.  Abdominal: Soft. She exhibits no distension. There is no tenderness. There is no rebound.  Hyperactive BS  Lymphadenopathy:    She has no cervical adenopathy.  Neurological: She is alert and oriented to person, place, and time.  Skin: Skin is warm and dry.  Vitals reviewed.         Assessment & Plan:

## 2014-04-09 NOTE — Patient Instructions (Signed)
Follow up as needed Drink plenty of fluids REST!!! Immodium or pepto as needed Bland diet- toast/bread, rice, potatoes, apple sauce, soup Call with any questions or concerns Hang in there!!!

## 2014-04-11 NOTE — Assessment & Plan Note (Signed)
New to provider.  No evidence of food poisoning based on hx and lack of others becoming ill.  Suspect viral gastroenteritis.  Treat supportively w/ fluids and immodium.  Reviewed supportive care and red flags that should prompt return.  Pt expressed understanding and is in agreement w/ plan.

## 2014-07-09 LAB — HEPATIC FUNCTION PANEL
ALT: 17 U/L (ref 7–35)
AST: 20 U/L (ref 13–35)
Alkaline Phosphatase: 68 U/L (ref 25–125)
Bilirubin, Total: 1.2 mg/dL

## 2014-07-09 LAB — BASIC METABOLIC PANEL
BUN: 17 mg/dL (ref 4–21)
Creatinine: 0.9 mg/dL (ref 0.5–1.1)

## 2014-07-09 LAB — HEMOGLOBIN A1C: Hgb A1c MFr Bld: 5.2 % (ref 4.0–6.0)

## 2014-07-09 LAB — LIPID PANEL
Cholesterol: 139 mg/dL (ref 0–200)
HDL: 68 mg/dL (ref 35–70)
LDL Cholesterol: 52 mg/dL
LDl/HDL Ratio: 2
Triglycerides: 93 mg/dL (ref 40–160)

## 2014-11-01 ENCOUNTER — Telehealth: Payer: Self-pay

## 2014-11-01 ENCOUNTER — Other Ambulatory Visit: Payer: Self-pay

## 2014-11-01 ENCOUNTER — Ambulatory Visit: Payer: 59 | Admitting: Physician Assistant

## 2014-11-02 ENCOUNTER — Ambulatory Visit (INDEPENDENT_AMBULATORY_CARE_PROVIDER_SITE_OTHER): Payer: 59 | Admitting: Family Medicine

## 2014-11-02 ENCOUNTER — Encounter: Payer: Self-pay | Admitting: Family Medicine

## 2014-11-02 VITALS — BP 122/80 | HR 100 | Temp 98.0°F | Resp 16 | Ht 65.0 in | Wt 128.1 lb

## 2014-11-02 DIAGNOSIS — Z23 Encounter for immunization: Secondary | ICD-10-CM

## 2014-11-02 DIAGNOSIS — Z0000 Encounter for general adult medical examination without abnormal findings: Secondary | ICD-10-CM

## 2014-11-02 NOTE — Progress Notes (Signed)
   Subjective:    Patient ID: Karen Booker, female    DOB: 13-Dec-1990, 24 y.o.   MRN: [id number]  HPI CPE- UTD on GYN ([facility]).  Pt had blood work done at health screen at work and she will bring a copy to scan in chart.   Review of Systems Patient reports no vision/ hearing changes, adenopathy,fever, weight change,  persistant/recurrent hoarseness , swallowing issues, chest pain, palpitations, edema, persistant/recurrent cough, hemoptysis, dyspnea (rest/exertional/paroxysmal nocturnal), gastrointestinal bleeding (melena, rectal bleeding), abdominal pain, significant heartburn, bowel changes, GU symptoms (dysuria, hematuria, incontinence), Gyn symptoms (abnormal  bleeding, pain),  syncope, focal weakness, memory loss, numbness & tingling, skin/hair/nail changes, abnormal bruising or bleeding, anxiety, or depression.     Objective:   Physical Exam General Appearance:    Alert, cooperative, no distress, appears stated age  Head:    Normocephalic, without obvious abnormality, atraumatic  Eyes:    PERRL, conjunctiva/corneas clear, EOM's intact, fundi    benign, both eyes  Ears:    Normal TM's and external ear canals, both ears  Nose:   Nares normal, septum midline, mucosa normal, no drainage    or sinus tenderness  Throat:   Lips, mucosa, and tongue normal; teeth and gums normal  Neck:   Supple, symmetrical, trachea midline, no adenopathy;    Thyroid: no enlargement/tenderness/nodules  Back:     Symmetric, no curvature, ROM normal, no CVA tenderness  Lungs:     Clear to auscultation bilaterally, respirations unlabored  Chest Wall:    No tenderness or deformity   Heart:    Regular rate and rhythm, S1 and S2 normal, no murmur, rub   or gallop  Breast Exam:    Deferred to GYN  Abdomen:     Soft, non-tender, bowel sounds active all four quadrants,    no masses, no organomegaly  Genitalia:    Deferred to GYN  Rectal:    Extremities:   Extremities normal, atraumatic, no cyanosis or edema   Pulses:   2+ and symmetric all extremities  Skin:   Skin color, texture, turgor normal, no rashes or lesions  Lymph nodes:   Cervical, supraclavicular, and axillary nodes normal  Neurologic:   CNII-XII intact, normal strength, sensation and reflexes    throughout          Assessment & Plan:

## 2014-11-02 NOTE — Assessment & Plan Note (Signed)
Pt's PE WNL.  UTD on GYN.  Had recent labs done at work- no need to repeat.  Pt will bring copy for her chart.  Anticipatory guidance provided.  Flu shot given.

## 2014-11-02 NOTE — Progress Notes (Signed)
Pre visit review using our clinic review tool, if applicable. No additional management support is needed unless otherwise documented below in the visit note. 

## 2014-11-02 NOTE — Patient Instructions (Signed)
Follow up in 1 year or as needed No need for blood work today- just fax or drop off a copy of your work results Keep up the good work on [employer] and regular exercise- you look great!! Start Neutrogena Acne wash on both face and back but this is hormonal and should improve w/ time Call with any questions or concerns If you want to join [place] at the new [place] office, any scheduled appointments will automatically transfer and we will see you at [address] [place] [address], [place], [place] [address]  Have a great fall and congrats on the wedding!!!

## 2014-11-10 NOTE — Telephone Encounter (Signed)
No notes

## 2014-11-10 NOTE — Telephone Encounter (Signed)
No additional note

## 2014-11-10 NOTE — Telephone Encounter (Signed)
No additional notes

## 2014-11-12 ENCOUNTER — Encounter: Payer: Self-pay | Admitting: General Practice

## 2014-11-16 LAB — HM PAP SMEAR

## 2015-03-25 ENCOUNTER — Ambulatory Visit (INDEPENDENT_AMBULATORY_CARE_PROVIDER_SITE_OTHER): Payer: BC Managed Care – PPO | Admitting: Family Medicine

## 2015-03-25 ENCOUNTER — Encounter: Payer: Self-pay | Admitting: Family Medicine

## 2015-03-25 VITALS — BP 112/80 | HR 92 | Temp 98.2°F | Ht 65.0 in | Wt 128.4 lb

## 2015-03-25 DIAGNOSIS — R208 Other disturbances of skin sensation: Secondary | ICD-10-CM

## 2015-03-25 DIAGNOSIS — N644 Mastodynia: Secondary | ICD-10-CM

## 2015-03-25 DIAGNOSIS — R200 Anesthesia of skin: Secondary | ICD-10-CM

## 2015-03-25 NOTE — Progress Notes (Signed)
   Subjective:    Patient ID: Karen Booker, female    DOB: 07/23/1990, 25 y.o.   MRN: [id number]  HPI Elevated BP- pt had BP elevation at the dentist, SBP 128, HR 112.  2 weeks ago had L arm pain w/ numbness and tingling.  Occurred a couple hours after waking.  Hand numbness and tingling of 4th and 5th fingers.  Numbness and tingling has since stopped but L sided CP continues.  Pain described as sharp.  Not worse w/ movement.  No pain w/ exertion.  No SOB.  Some TTP over L chest wall and breast.  Pain is intermittent.  No attempt at OTC pain relief.  No associated nausea.  No change in activity level.   Review of Systems For ROS see HPI     Objective:   Physical Exam  Constitutional: She is oriented to person, place, and time. She appears well-developed and well-nourished. No distress.  HENT:  Head: Normocephalic and atraumatic.  Eyes: Conjunctivae and EOM are normal. Pupils are equal, round, and reactive to light.  Neck: Normal range of motion. Neck supple. No thyromegaly present.  Cardiovascular: Normal rate, regular rhythm, normal heart sounds and intact distal pulses.   No murmur heard. Pulmonary/Chest: Effort normal and breath sounds normal. No respiratory distress. She exhibits tenderness (TTP over L lateral breast w/o mass noted).  Abdominal: Soft. She exhibits no distension. There is no tenderness.  Musculoskeletal: She exhibits tenderness (mild TTP over L biceps which causes numbness in L 4th/5th finger w/ pressure). She exhibits no edema.  Lymphadenopathy:    She has no cervical adenopathy.  Neurological: She is alert and oriented to person, place, and time.  Skin: Skin is warm and dry.  Psychiatric: She has a normal mood and affect. Her behavior is normal.  Vitals reviewed.         Assessment & Plan:

## 2015-03-25 NOTE — Progress Notes (Signed)
Pre visit review using our clinic review tool, if applicable. No additional management support is needed unless otherwise documented below in the visit note. 

## 2015-03-25 NOTE — Patient Instructions (Signed)
Follow up as needed We'll call you with your breast imaging appt Start the Mobic once daily x7 days (for pain and inflammation) Use the Flexeril nightly for the next 5-7 days for pain/spasm Warm heat for breast pain Try and avoid caffeine as this worsens breast pain Call with any questions or concerns Hang in there!!!

## 2015-03-27 DIAGNOSIS — R200 Anesthesia of skin: Secondary | ICD-10-CM | POA: Insufficient documentation

## 2015-03-27 NOTE — Assessment & Plan Note (Signed)
New.  Reproducible w/ palpation of L bicep.  Suspect nerve irritation/inflammation.  Start scheduled NSAIDs.  Reviewed supportive care and red flags that should prompt return.  Pt expressed understanding and is in agreement w/ plan.

## 2015-03-27 NOTE — Assessment & Plan Note (Signed)
New.  No palpable mass on exam but pt does have L lateral breast tenderness.  Get [place] to assess.  Pt expressed understanding and is in agreement w/ plan.

## 2015-03-29 ENCOUNTER — Encounter: Payer: Self-pay | Admitting: Family Medicine

## 2015-03-29 NOTE — Addendum Note (Signed)
Addended by: Midge Minium on: 03/29/2015 11:17 AM   Modules accepted: Orders

## 2015-04-01 ENCOUNTER — Ambulatory Visit
Admission: RE | Admit: 2015-04-01 | Discharge: 2015-04-01 | Disposition: A | Discharge status: Home / Self Care | Payer: BC Managed Care – PPO | Source: Ambulatory Visit | Attending: Family Medicine | Admitting: Family Medicine

## 2015-04-01 DIAGNOSIS — N644 Mastodynia: Secondary | ICD-10-CM

## 2015-08-11 ENCOUNTER — Encounter: Payer: Self-pay | Admitting: Family Medicine

## 2015-08-11 DIAGNOSIS — R002 Palpitations: Secondary | ICD-10-CM

## 2015-08-11 NOTE — Telephone Encounter (Signed)
Referral placed.

## 2015-09-08 ENCOUNTER — Encounter: Payer: Self-pay | Admitting: Physician Assistant

## 2015-09-08 ENCOUNTER — Ambulatory Visit (INDEPENDENT_AMBULATORY_CARE_PROVIDER_SITE_OTHER): Payer: BC Managed Care – PPO | Admitting: Physician Assistant

## 2015-09-08 VITALS — BP 130/74 | HR 129 | Ht 65.0 in | Wt 128.0 lb

## 2015-09-08 DIAGNOSIS — F419 Anxiety disorder, unspecified: Secondary | ICD-10-CM

## 2015-09-08 DIAGNOSIS — R000 Tachycardia, unspecified: Secondary | ICD-10-CM

## 2015-09-08 LAB — CBC
HCT: 46.3 % — ABNORMAL HIGH (ref 35.0–45.0)
Hemoglobin: 15.6 g/dL — ABNORMAL HIGH (ref 11.7–15.5)
MCH: 32.8 pg (ref 27.0–33.0)
MCHC: 33.7 g/dL (ref 32.0–36.0)
MCV: 97.3 fL (ref 80.0–100.0)
MPV: 9.2 fL (ref 7.5–12.5)
Platelets: 374 10*3/uL (ref 140–400)
RBC: 4.76 MIL/uL (ref 3.80–5.10)
RDW: 12.5 % (ref 11.0–15.0)
WBC: 8.5 10*3/uL (ref 3.8–10.8)

## 2015-09-08 LAB — BASIC METABOLIC PANEL
BUN: 11 mg/dL (ref 7–25)
CO2: 23 mmol/L (ref 20–31)
Calcium: 9.6 mg/dL (ref 8.6–10.2)
Chloride: 103 mmol/L (ref 98–110)
Creat: 0.84 mg/dL (ref 0.50–1.10)
Glucose, Bld: 88 mg/dL (ref 65–99)
Potassium: 4.2 mmol/L (ref 3.5–5.3)
Sodium: 141 mmol/L (ref 135–146)

## 2015-09-08 LAB — TSH: TSH: 1.5 mIU/L

## 2015-09-08 NOTE — Progress Notes (Addendum)
Cardiology Office Note    Date:  09/08/2015   ID:  [name], [name] 12-03-1990, MRN [id number]  PCP:  Annye Asa, MD  Cardiologist:  New - Dr. Harrington Challenger  Chief Complaint  Patient presents with  . New Patient (Initial Visit)    seen with DOD Dr. [name] Carnes    History of Present Illness:  Karen Booker (also known as [name]) is a 25 y.o. female with PMH of anxiety referred to cardiology for evaluation of tachycardia. She says she was ill with a cold in 2011, she was noted to have tachycardia during the illness. Her PCP advised her to follow-up after illness resolve regarding her tachycardia, per patient, she remained tachycardic on follow-up. No further workup was pursued. She has noticed her heart rate to be fast persistently above 90 bpm for the past 6 years. She denies any dizziness or passing out spells. He denies any chest pain or shortness breath. She has no lower extremity edema, orthopnea paroxysmal nocturnal dyspnea. She has no history of congenital heart disease. She has no prior history of hyperthyroidism. She works as a Government social research officer at [employer], she says she is not active and her job is not stressful.  On initial arrival to the cardiology office, her heart rate was 129 on the EKG. EKG showed a sinus tachycardia without significant ST-T wave changes. The patient was seen along with Dr. Harrington Challenger DOD. We have obtained orthostatic vital signs. Blood pressure did not change with position, however heart rate did increase upon standing. She has no symptom with change in body position. (Orthostatic vital: Lying HR 117, BP 118/80. Sitting HR 117, BP 117/81. Standing at 5min HR 132, BP 111/80. Standing at 81min, HR 140, BP 128/87)     Past Medical History:  Diagnosis Date  . Anxiety   . Depression   . IBS (irritable bowel syndrome)   . Irritable bowel syndrome   . Neuromuscular disorder ([place])   . Other preterm infants, unspecified (weight)(765.10)   . Scoliosis (and  kyphoscoliosis), idiopathic   . Urinary tract infection, site not specified   . UTI (urinary tract infection)     Past Surgical History:  Procedure Laterality Date  . WISDOM TOOTH EXTRACTION  2010    Current Medications: Outpatient Medications Prior to Visit  Medication Sig Dispense Refill  . ALPRAZolam (XANAX) 0.5 MG tablet Take 1 tablet (0.5 mg total) by mouth 2 (two) times daily as needed for anxiety. 60 tablet 1  . cyclobenzaprine (FLEXERIL) 10 MG tablet Take 1 tablet (10 mg total) by mouth 3 (three) times daily as needed for muscle spasms. 45 tablet 0  . diclofenac (VOLTAREN) 75 MG EC tablet Take 1 tablet (75 mg total) by mouth 2 (two) times daily. 20 tablet 0  . ORSYTHIA 0.1-20 MG-MCG per tablet     . tiZANidine (ZANAFLEX) 4 MG capsule Take 1 capsule (4 mg total) by mouth 3 (three) times daily. 30 capsule 0  . meloxicam (MOBIC) 15 MG tablet Take 1 tablet (15 mg total) by mouth daily. (Patient not taking: Reported on 09/08/2015) 30 tablet 1   No facility-administered medications prior to visit.      Allergies:   Cephalexin and Nitrofurantoin   Social History   Social History  . Marital status: Married    Spouse name: N/A  . Number of children: N/A  . Years of education: N/A   Occupational History  . Student at [employer]  Topics  . Smoking status: Never Smoker  . Smokeless tobacco: Never Used  . Alcohol use Yes     Comment: Socially  . Drug use: No  . Sexual activity: Yes    Birth control/ protection: Pill   Other Topics Concern  . None   Social History Narrative  . None     Family History:  The patient's family history includes Alcohol abuse in her father; Arthritis in her maternal grandmother; Cancer in her paternal grandfather; Diabetes in her maternal grandmother; Hyperlipidemia in her mother; Mental illness in her sister.   ROS:   Please see the history of present illness.    ROS All other systems reviewed and are  negative.   PHYSICAL EXAM:   VS:  BP 130/74   Pulse (!) 129   Ht 5\' 5"  (1.651 m)   Wt 128 lb (58.1 kg)   LMP 08/18/2015 (Approximate)   SpO2 99%   BMI 21.30 kg/m    GEN: Well nourished, well developed, in no acute distress  HEENT: normal  Neck: no JVD, carotid bruits, or masses Cardiac: sinus tach; no murmurs, rubs, or gallops,no edema  Respiratory:  clear to auscultation bilaterally, normal work of breathing GI: soft, nontender, nondistended, + BS MS: no deformity or atrophy  Skin: warm and dry, no rash Neuro:  Alert and Oriented x 3, Strength and sensation are intact Psych: euthymic mood, full affect  Wt Readings from Last 3 Encounters:  09/08/15 128 lb (58.1 kg)  03/25/15 128 lb 6.4 oz (58.2 kg)  11/02/14 128 lb 2 oz (58.1 kg)      Studies/Labs Reviewed:   EKG:  EKG is ordered today.  The ekg ordered today demonstrates sinus tach without significant ST-T wave changes  Recent Labs: No results found for requested labs within last 8760 hours.   Lipid Panel    Component Value Date/Time   CHOL 139 07/09/2014   TRIG 93 07/09/2014   HDL 68 07/09/2014   CHOLHDL 2 12/27/2010 0831   VLDL 17.2 12/27/2010 0831   LDLCALC 52 07/09/2014    Additional studies/ records that were reviewed today include:  Previous notes    ASSESSMENT:    1. Sinus tachycardia ([place])   2. Anxiety      PLAN:  In order of problems listed above:  1. Sinus tachycardia: patient seen by Dr. Harrington Challenger, DOD, plan for cortisol, UA, CBC, BMET, TSH. 24 hour Holter monitor and Echocardiogram to r/o structural heart disease. Given her h/o viral infection prior to onset of symptom and postural changes of HR, symptom most consistent with POTS, her HR did increase to > 120 bpm, but did not have a jump of > 30bpm from lying. Differential diagnosis also include inappropriate sinus tachycardia, however symptom more consistent with POTS. Advised to eat more salt and keep hydration  2. Anxiety: on Xanax, per pt,  only takes once a month    Addendum: During blood draw after today's visit, she had a vasovagal response. Code Blue was temporarily activated, but cancelled soon after patient to reassessed by Dr. Harrington Challenger. She appears to be pale and diaphoretic. We took her back to her previous room, laid her flat and gave her crackers and coke. Her initial BP was 72/46, repeat BP 92/70 --> 102/68. Her symptom quickly resolved. She says she has had similar response to blood draw in the past as well. She was released from office to go to her meeting around 2PM once her BP improved and symptom resolved.  Medication Adjustments/Labs and Tests Ordered: Current medicines are reviewed at length with the patient today.  Concerns regarding medicines are outlined above.  Medication changes, Labs and Tests ordered today are listed in the Patient Instructions below. Patient Instructions  Medication Instructions:  Your physician recommends that you continue on your current medications as directed. Please refer to the Current Medication list given to you today.  Labwork: TODAY: CORTISOL, CBC; BMET; TSH;/ URINALYSIS   Testing/Procedures: Your physician has requested that you have an echocardiogram. Echocardiography is a painless test that uses sound waves to create images of your heart. It provides your doctor with information about the size and shape of your heart and how well your heart's chambers and valves are working. This procedure takes approximately one hour. There are no restrictions for this procedure.  Your physician has recommended that you wear a holter monitor. Holter monitors are medical devices that record the heart's electrical activity. Doctors most often use these monitors to diagnose arrhythmias. Arrhythmias are problems with the speed or rhythm of the heartbeat. The monitor is a small, portable device. You can wear one while you do your normal daily activities. This is usually used to diagnose what is  causing palpitations/syncope (passing out).  Your physician has recommended that you wear a 24 HOUR holter monitor. Holter monitors are medical devices that record the heart's electrical activity. Doctors most often use these monitors to diagnose arrhythmias. Arrhythmias are problems with the speed or rhythm of the heartbeat. The monitor is a small, portable device. You can wear one while you do your normal daily activities. This is usually used to diagnose what is causing palpitations/syncope (passing out).   Follow-Up: Your physician recommends that you schedule a follow-up appointment in: [place].    Any Other Special Instructions Will Be Listed Below (If Applicable).     If you need a refill on your cardiac medications before your next appointment, please call your pharmacy.      Hilbert Corrigan, [place]  09/08/2015 6:58 PM    [facility] [place], [place], [place]  [address] Phone: [phone]; Fax: [phone]

## 2015-09-08 NOTE — Patient Instructions (Addendum)
Medication Instructions:  Your physician recommends that you continue on your current medications as directed. Please refer to the Current Medication list given to you today.  Labwork: TODAY: CORTISOL, CBC; BMET; TSH;/ URINALYSIS   Testing/Procedures: Your physician has requested that you have an echocardiogram. Echocardiography is a painless test that uses sound waves to create images of your heart. It provides your doctor with information about the size and shape of your heart and how well your heart's chambers and valves are working. This procedure takes approximately one hour. There are no restrictions for this procedure.  Your physician has recommended that you wear a holter monitor. Holter monitors are medical devices that record the heart's electrical activity. Doctors most often use these monitors to diagnose arrhythmias. Arrhythmias are problems with the speed or rhythm of the heartbeat. The monitor is a small, portable device. You can wear one while you do your normal daily activities. This is usually used to diagnose what is causing palpitations/syncope (passing out).  Your physician has recommended that you wear a 24 HOUR holter monitor. Holter monitors are medical devices that record the heart's electrical activity. Doctors most often use these monitors to diagnose arrhythmias. Arrhythmias are problems with the speed or rhythm of the heartbeat. The monitor is a small, portable device. You can wear one while you do your normal daily activities. This is usually used to diagnose what is causing palpitations/syncope (passing out).   Follow-Up: Your physician recommends that you schedule a follow-up appointment in: [place].    Any Other Special Instructions Will Be Listed Below (If Applicable).     If you need a refill on your cardiac medications before your next appointment, please call your pharmacy.

## 2015-09-09 ENCOUNTER — Encounter: Payer: Self-pay | Admitting: Physician Assistant

## 2015-09-09 LAB — CORTISOL: Cortisol, Plasma: 27.3 ug/dL — ABNORMAL HIGH

## 2015-09-09 LAB — URINALYSIS
Bilirubin Urine: NEGATIVE
Glucose, UA: NEGATIVE
Hgb urine dipstick: NEGATIVE
Ketones, ur: NEGATIVE
Leukocytes, UA: NEGATIVE
Nitrite: NEGATIVE
Protein, ur: NEGATIVE
Specific Gravity, Urine: 1.009 (ref 1.001–1.035)
pH: 7.5 (ref 5.0–8.0)

## 2015-09-12 ENCOUNTER — Other Ambulatory Visit (HOSPITAL_COMMUNITY): Payer: BC Managed Care – PPO

## 2015-09-14 ENCOUNTER — Ambulatory Visit (INDEPENDENT_AMBULATORY_CARE_PROVIDER_SITE_OTHER): Payer: BC Managed Care – PPO

## 2015-09-14 DIAGNOSIS — R000 Tachycardia, unspecified: Secondary | ICD-10-CM | POA: Diagnosis not present

## 2015-10-03 ENCOUNTER — Other Ambulatory Visit (HOSPITAL_COMMUNITY): Payer: BC Managed Care – PPO

## 2015-10-10 ENCOUNTER — Other Ambulatory Visit: Payer: Self-pay

## 2015-10-10 ENCOUNTER — Ambulatory Visit (HOSPITAL_COMMUNITY): Payer: BC Managed Care – PPO | Attending: Cardiology

## 2015-10-10 DIAGNOSIS — F419 Anxiety disorder, unspecified: Secondary | ICD-10-CM | POA: Diagnosis not present

## 2015-10-10 DIAGNOSIS — R000 Tachycardia, unspecified: Secondary | ICD-10-CM | POA: Insufficient documentation

## 2015-10-28 ENCOUNTER — Encounter: Payer: Self-pay | Admitting: Family Medicine

## 2015-11-01 ENCOUNTER — Encounter: Payer: Self-pay | Admitting: Internal Medicine

## 2015-11-03 ENCOUNTER — Encounter: Payer: Self-pay | Admitting: Family Medicine

## 2015-11-03 ENCOUNTER — Ambulatory Visit (INDEPENDENT_AMBULATORY_CARE_PROVIDER_SITE_OTHER): Payer: BC Managed Care – PPO | Admitting: Family Medicine

## 2015-11-03 VITALS — BP 122/78 | HR 104 | Temp 98.1°F | Resp 16 | Ht 65.0 in | Wt 126.2 lb

## 2015-11-03 DIAGNOSIS — Z23 Encounter for immunization: Secondary | ICD-10-CM

## 2015-11-03 DIAGNOSIS — Z0000 Encounter for general adult medical examination without abnormal findings: Secondary | ICD-10-CM | POA: Diagnosis not present

## 2015-11-03 NOTE — Progress Notes (Signed)
Pre visit review using our clinic review tool, if applicable. No additional management support is needed unless otherwise documented below in the visit note. 

## 2015-11-03 NOTE — Patient Instructions (Signed)
Follow up in 1 year or as needed Keep up the good work on healthy diet and regular exercise- you look great!! Start daily prenatal vitamins- just in case!! Call with any questions or concerns Happy Fall!!!

## 2015-11-03 NOTE — Progress Notes (Signed)
   Subjective:    Patient ID: Karen Booker, female    DOB: 05/22/1990, 25 y.o.   MRN: [id number]  HPI CPE- pt had recent lab work done w/ cardiology.  Due for GYN- pt plans to schedule.   Review of Systems Patient reports no vision/ hearing changes, adenopathy,fever, weight change,  persistant/recurrent hoarseness , swallowing issues, chest pain, edema, persistant/recurrent cough, hemoptysis, dyspnea (rest/exertional/paroxysmal nocturnal), gastrointestinal bleeding (melena, rectal bleeding), abdominal pain, significant heartburn, bowel changes, GU symptoms (dysuria, hematuria, incontinence), Gyn symptoms (abnormal  bleeding, pain),  syncope, focal weakness, memory loss, numbness & tingling, skin/hair/nail changes, abnormal bruising or bleeding, anxiety, or depression.   + palpitations- seeing Cardiology    Objective:   Physical Exam General Appearance:    Alert, cooperative, no distress, appears stated age  Head:    Normocephalic, without obvious abnormality, atraumatic  Eyes:    PERRL, conjunctiva/corneas clear, EOM's intact, fundi    benign, both eyes  Ears:    Normal TM's and external ear canals, both ears  Nose:   Nares normal, septum midline, mucosa normal, no drainage    or sinus tenderness  Throat:   Lips, mucosa, and tongue normal; teeth and gums normal  Neck:   Supple, symmetrical, trachea midline, no adenopathy;    Thyroid: no enlargement/tenderness/nodules  Back:     Symmetric, no curvature, ROM normal, no CVA tenderness  Lungs:     Clear to auscultation bilaterally, respirations unlabored  Chest Wall:    No tenderness or deformity   Heart:    Regular rate and rhythm, S1 and S2 normal, no murmur, rub   or gallop  Breast Exam:    Deferred to GYN  Abdomen:     Soft, non-tender, bowel sounds active all four quadrants,    no masses, no organomegaly  Genitalia:    Deferred to GYN  Rectal:    Extremities:   Extremities normal, atraumatic, no cyanosis or edema  Pulses:   2+  and symmetric all extremities  Skin:   Skin color, texture, turgor normal, no rashes or lesions  Lymph nodes:   Cervical, supraclavicular, and axillary nodes normal  Neurologic:   CNII-XII intact, normal strength, sensation and reflexes    throughout          Assessment & Plan:

## 2015-11-03 NOTE — Assessment & Plan Note (Signed)
Pt's PE WNL.  Due for GYN- pt plans to call.  Reviewed recent labs- no need to repeat today. Flu shot given. Anticipatory guidance provided.

## 2015-11-14 ENCOUNTER — Encounter: Payer: Self-pay | Admitting: Internal Medicine

## 2015-11-14 ENCOUNTER — Ambulatory Visit (INDEPENDENT_AMBULATORY_CARE_PROVIDER_SITE_OTHER): Payer: BC Managed Care – PPO | Admitting: Internal Medicine

## 2015-11-14 VITALS — BP 132/70 | HR 102 | Ht 65.0 in | Wt 130.2 lb

## 2015-11-14 DIAGNOSIS — R002 Palpitations: Secondary | ICD-10-CM

## 2015-11-14 DIAGNOSIS — R42 Dizziness and giddiness: Secondary | ICD-10-CM

## 2015-11-14 NOTE — Progress Notes (Signed)
Cardiology Office Note   Date:  11/14/2015   ID:  Karen Booker, Karen Booker 1990-04-03, MRN [id number]  PCP:  Karen Asa, MD  Cardiologist:   [name] Carnes, MD   Pt presents for follow up of palpiatsions      History of Present Illness: Karen Booker is a 25 y.o. female with a history of tachycardia  I saw her earlier In the summer    His of palpitations , tachycardia  When I saw her with PA she was tachycardic  REcomm holter, echo  Recomm she increase fluid and salt intake  Echo was normal  Holter showed average HR in 24 hours was 84. Since seen she has done well  No dizzines  Breathing is OK  She has increased her activity with exercise         Outpatient Medications Prior to Visit  Medication Sig Dispense Refill  . ALPRAZolam (XANAX) 0.5 MG tablet Take 1 tablet (0.5 mg total) by mouth 2 (two) times daily as needed for anxiety. 60 tablet 1  . diclofenac (VOLTAREN) 75 MG EC tablet Take 1 tablet (75 mg total) by mouth 2 (two) times daily. 20 tablet 0  . meloxicam (MOBIC) 15 MG tablet Take 1 tablet (15 mg total) by mouth daily. 30 tablet 1  . ORSYTHIA 0.1-20 MG-MCG per tablet     . tiZANidine (ZANAFLEX) 4 MG capsule Take 1 capsule (4 mg total) by mouth 3 (three) times daily. 30 capsule 0   No facility-administered medications prior to visit.      Allergies:   Cephalexin and Nitrofurantoin   Past Medical History:  Diagnosis Date  . Anxiety   . Depression   . IBS (irritable bowel syndrome)   . Irritable bowel syndrome   . Neuromuscular disorder ([place])   . Other preterm infants, unspecified (weight)(765.10)   . Scoliosis (and kyphoscoliosis), idiopathic   . Urinary tract infection, site not specified   . UTI (urinary tract infection)     Past Surgical History:  Procedure Laterality Date  . WISDOM TOOTH EXTRACTION  2010     Social History:  The patient  reports that she has never smoked. She has never used smokeless tobacco. She reports that she drinks alcohol. She  reports that she does not use drugs.   Family History:  The patient's family history includes Alcohol abuse in her father; Arthritis in her maternal grandmother; Cancer in her paternal grandfather; Diabetes in her maternal grandmother; Hyperlipidemia in her mother; Mental illness in her sister.    ROS:  Please see the history of present illness. All other systems are reviewed and  Negative to the above problem except as noted.    PHYSICAL EXAM: VS:  BP 132/70   Pulse (!) 102   Ht 5\' 5"  (1.651 m)   Wt 130 lb 3.2 oz (59.1 kg)   SpO2 99%   BMI 21.67 kg/m   GEN: Well nourished, well developed, in no acute distress  HEENT: normal  Neck: no JVD, carotid bruits, or masses Cardiac: RRR; no murmurs, rubs, or gallops,no edema  Respiratory:  clear to auscultation bilaterally, normal work of breathing GI: soft, nontender, nondistended, + BS  No hepatomegaly  MS: no deformity Moving all extremities   Skin: warm and dry, no rash Neuro:  Strength and sensation are intact Psych: euthymic mood, full affect   EKG:  EKG is not  ordered today.   Lipid Panel    Component Value Date/Time   CHOL 139  07/09/2014   TRIG 93 07/09/2014   HDL 68 07/09/2014   CHOLHDL 2 12/27/2010 0831   VLDL 17.2 12/27/2010 0831   LDLCALC 52 07/09/2014      Wt Readings from Last 3 Encounters:  11/14/15 130 lb 3.2 oz (59.1 kg)  11/03/15 126 lb 4 oz (57.3 kg)  09/08/15 128 lb (58.1 kg)      ASSESSMENT AND PLAN:  1  Tachycardia  Pt may have mild autonomic dysfunction with tachycardai  This has improved  She is asymptomatic I encouraged her to stay active  Keep drinking adequate fluids    F?U as needed  She is low risk  OK to proceed with trying to get pregnant     Disposition:   FU with  Me as needed    Signed, [name] Carnes, MD  11/14/2015 9:58 AM    [address] [place], [place], [place]  [address] Phone: [phone]; Fax: [phone]

## 2015-11-14 NOTE — Patient Instructions (Signed)
Your physician recommends that you continue on your current medications as directed. Please refer to the Current Medication list given to you today. Your physician recommends that you schedule a follow-up appointment as needed with Dr. Ross.   

## 2015-11-16 ENCOUNTER — Encounter: Payer: Self-pay | Admitting: Family Medicine

## 2015-11-16 ENCOUNTER — Encounter: Payer: Self-pay | Admitting: General Practice

## 2016-01-02 ENCOUNTER — Other Ambulatory Visit (HOSPITAL_COMMUNITY): Payer: Self-pay | Admitting: Obstetrics and Gynecology

## 2016-01-06 ENCOUNTER — Ambulatory Visit (HOSPITAL_COMMUNITY)
Admission: RE | Admit: 2016-01-06 | Discharge: 2016-01-06 | Disposition: A | Discharge status: Home / Self Care | Payer: BC Managed Care – PPO | Source: Ambulatory Visit | Attending: Family Medicine | Admitting: Family Medicine

## 2016-01-06 DIAGNOSIS — Z315 Encounter for genetic counseling: Secondary | ICD-10-CM

## 2016-01-06 DIAGNOSIS — Z319 Encounter for procreative management, unspecified: Secondary | ICD-10-CM | POA: Insufficient documentation

## 2016-01-06 HISTORY — DX: Encounter for procreative genetic counseling: Z31.5

## 2016-01-06 NOTE — Progress Notes (Addendum)
Genetic Counseling  Preconception Note  Appointment Date:  01/06/2016 Referred By: Thornell Sartorius, MD Date of Birth:  [birth year] Partner:  Karen Booker   Attending: Renella Cunas, MD   I met with Karen Booker and her husband, Karen Booker, for preconception genetic counseling because of a family history of mental health disorders.  In summary:  Discussed family history of mental illness  Bipolar disorder for Karen Booker   Anxiety for Karen Booker, her twin sister, and her mother  Reviewed majority of mental health conditions suspected to follow multifactorial inheritance  Empiric evidence suggests approximate 15-30% recurrence risk for bipolar disorder for offspring of affected individual  Empiric evidence suggests approximate 8-31% recurrence risk for generalized anxiety disorder for first degree relatives  Reviewed that some families have been observed to have pattern of inheritance suggestive of autosomal dominant inheritance  Discussed specific genetic testing options not currently available for the majority of mental health conditions, though there is ongoing research  Discussed general population carrier screening options:  ACOG recommended (CF, SMA, hemoglobinopathies) and option of pan-ethnic carrier screening  Karen Booker elected to pursue pan-ethnic carrier screening, panel through [facility], at the time of today's visit  We began by reviewing the family history in detail. Karen Booker reported that he has a personal diagnosis of bipolar disorder type I, diagnosed in his 75s. No additional relatives, including his full sibling, have a diagnosis of mental health conditions. However, he reported that information is somewhat limited for extended relatives given that they live in [place]. Karen Booker reported a personal history of anxiety for herself and also anxiety for her identical twin sister. Karen Booker reported that she was diagnosed at age 38 years old but reported that she  was exhibiting symptoms years before receiving a diagnosis. Karen Booker also reported that her mother has anxiety. The patient's father has alcoholism, and her other sister has anorexia.   We discussed that for the majority of cases of mental health conditions, such as bipolar disorder and anxiety, a single underlying genetic cause is not known but rather a combination of genetic and environmental factors (multifactorial inheritance) are suspected to contribute to their onset. We spent time reviewing that while the genetic factors potentially contributed cannot be controlled, even in cases where they may be identified, some though certainly not all of the potential environmental contributors could potentially be modified. Bipolar disorder is estimated to occur in 1% of the population with equal female and female prevalence.  Recurrence risk of bipolar disorder for offspring of an affected individual based on empiric evidence is estimated to be approximately 15-30%, in the case of multifactorial inheritance observed. For generalized anxiety disorder is reported to have a higher prevalence in females versus males. Recurrence risk for first degree relatives in the case of multifactorial inheritance is approximately 8-31%, based on empirical evidence. We reviewed that the increased number of affected relatives for Karen Booker could indicate a potentially increased genetic contribution, though the couple understands this is difficult to discern. We discussed that in some families, mental health conditions such as bipolar disorder or anxiety may even be dominant, meaning that when one parent has the condition each child could have up to a 50% risk to inherit the condition. We reviewed that for the majority of cases, genetic testing is not currently available for mental health conditions. However, we discussed the benefit in making pediatricians aware of this family history to ensure that family members are followed  appropriately. The  patient understands that prenatal testing or screening is not available for the majority of mental health conditions.   The family histories were otherwise found to be noncontributory for birth defects, mental retardation, and known genetic conditions. Without further information regarding the provided family history, an accurate genetic risk cannot be calculated. Further genetic counseling is warranted if more information is obtained.  We discussed the option of carrier screening for single gene disorders with the couple. We discussed that each person is estimated to have 7-10 genes that do not work correctly, meaning that each person is estimated to be a carrier of 7-10 different genetic conditions. They were counseled that the majority of these conditions follow autosomal recessive inheritance. We reviewed that we each have two copies of all of our genes, one inherited from each parent. If one copy of the pair of genes is changed in a way that causes it not to function properly, but the other copy of the gene works, then a person is called a "carrier" for that condition. However, because the other copy of the gene works correctly, the person typically does not have any health problems related to the non-working gene(s). It is only when BOTH copies of the pair of genes do not work correctly that a person will have the condition and the related health problems.   There are a myriad of genetic disorders that occur more frequently in specific ethnic groups, those which can be traced to particular geographic locations. We discussed that although these genetic disorders are much more prevalent in specific ethnic groups, as families are becoming increasingly multiracial and multicultural, these conditions can occur in anyone from any race or ethnicity. For this reason, we discussed the availability of ethnic specific genetic carrier screening, professional society (ACOG) recommended carrier  testing, and pan-ethnic carrier screening.   We reviewed that ACOG currently recommends that all patients be offered carrier screening for cystic fibrosis, spinal muscular atrophy and hemoglobinopathies. In addition, they were counseled that there are a variety of genetic screening laboratories that have pan-ethnic, or expanded, carrier screening panels, which evaluate carrier status for a wide range of genetic conditions. Some of these conditions are severe and actionable, but also rare; others occur more commonly, but are less severe. We discussed that testing options range from screening for a single condition to panels of more than 175 autosomal or X-linked recessive genetic conditions. We reviewed that the prevalence of each condition varies (and often varies with ethnicity). Thus the couples' background risk to be a carrier for each of these various conditions would range, and in some cases be very low or unknown. Similarly, the detection rate varies with each condition and also varies in some cases with ethnicity, ranging from greater than 99% (in the case of hemoglobinopathies) to unknown. Similarly, given the low prevalence of some of these conditions, the specific phenotype(s) may be less well defined than others. We reviewed that a negative carrier screen would thus reduce, but not eliminate the chance to be a carrier for these conditions. For some conditions included on specific pan-ethnic carrier screening panels, the pre-test carrier frequency and/or the detection rate is unknown. We reviewed that in the event that one partner is found to be a carrier for one or more conditions, carrier screening would be available to the partner for those conditions. We discussed that for the majority of autosomal recessive and X-linked recessive conditions, being a carrier does not correspond to symptoms of the condition. However, we discussed that  there are some exceptions where carrier status has been identified  to confer an increased risk for potential medical issues for the carrier. The couple was provided with written information regarding pan-ethnic carrier screening option. We discussed the risks, benefits, and limitations of carrier screening with the couple. We discussed the possible results that the tests might provide including: positive, negative, unanticipated, and no result. Finally, they were counseled regarding the cost and potential out of pocket expenses. After thoughtful consideration of their options, Karen Booker elected to pursue pan-ethnic expanded carrier screening (including the ACOG recommended CF, SMA, and hemoglobinopathies) through [facility]. This panel screens for carrier status of 175 hereditary disorders. We discussed that results will be available in approximately 2 weeks.   We briefly reviewed chromosomes and examples of aneuploidy. Given the patient's age alone, a pregnancy in the near future would not be considered increased risk for fetal aneuploidy. We reviewed examples of these conditions and examples of prenatal screening and testing for fetal aneuploidy. They understand that preconception screening/testing is not available for fetal aneuploidy in the vast majority of pregnancies.   I counseled this couple regarding the above risks and available options.  The approximate face-to-face time with the genetic counselor was 50 minutes.  Chipper Oman, MS Certified Genetic Counselor 01/06/2016

## 2016-01-15 LAB — HM PAP SMEAR

## 2016-01-16 ENCOUNTER — Other Ambulatory Visit (HOSPITAL_COMMUNITY): Payer: Self-pay

## 2016-11-07 ENCOUNTER — Encounter (HOSPITAL_COMMUNITY): Payer: Self-pay | Admitting: *Deleted

## 2016-11-07 ENCOUNTER — Inpatient Hospital Stay (HOSPITAL_COMMUNITY)
Admission: AD | Admit: 2016-11-07 | Discharge: 2016-11-07 | Disposition: A | Discharge status: Home / Self Care | Payer: BC Managed Care – PPO | Source: Ambulatory Visit | Attending: Obstetrics and Gynecology | Admitting: Obstetrics and Gynecology

## 2016-11-07 ENCOUNTER — Inpatient Hospital Stay (HOSPITAL_COMMUNITY): Payer: BC Managed Care – PPO

## 2016-11-07 DIAGNOSIS — O209 Hemorrhage in early pregnancy, unspecified: Secondary | ICD-10-CM

## 2016-11-07 DIAGNOSIS — Z3A01 Less than 8 weeks gestation of pregnancy: Secondary | ICD-10-CM | POA: Diagnosis not present

## 2016-11-07 DIAGNOSIS — Z3490 Encounter for supervision of normal pregnancy, unspecified, unspecified trimester: Secondary | ICD-10-CM

## 2016-11-07 LAB — URINALYSIS, ROUTINE W REFLEX MICROSCOPIC
Bilirubin Urine: NEGATIVE
Glucose, UA: NEGATIVE mg/dL
Hgb urine dipstick: NEGATIVE
Ketones, ur: NEGATIVE mg/dL
Leukocytes, UA: NEGATIVE
Nitrite: NEGATIVE
Protein, ur: NEGATIVE mg/dL
Specific Gravity, Urine: 1.002 — ABNORMAL LOW (ref 1.005–1.030)
pH: 7 (ref 5.0–8.0)

## 2016-11-07 LAB — CBC
HCT: 38.1 % (ref 36.0–46.0)
Hemoglobin: 13.3 g/dL (ref 12.0–15.0)
MCH: 32.7 pg (ref 26.0–34.0)
MCHC: 34.9 g/dL (ref 30.0–36.0)
MCV: 93.6 fL (ref 78.0–100.0)
Platelets: 334 10*3/uL (ref 150–400)
RBC: 4.07 MIL/uL (ref 3.87–5.11)
RDW: 12.3 % (ref 11.5–15.5)
WBC: 6.1 10*3/uL (ref 4.0–10.5)

## 2016-11-07 LAB — POCT PREGNANCY, URINE: Preg Test, Ur: POSITIVE — AB

## 2016-11-07 LAB — HCG, QUANTITATIVE, PREGNANCY: hCG, Beta Chain, Quant, S: 6028 m[IU]/mL — ABNORMAL HIGH (ref <5)

## 2016-11-07 LAB — ABO/RH: ABO/RH(D): O POS

## 2016-11-07 NOTE — MAU Provider Note (Signed)
History     CSN: [id number]  Arrival date and time: 11/07/16 1045   First Provider Initiated Contact with Patient 11/07/16 1131      Chief Complaint  Patient presents with  . Vaginal Bleeding   Vaginal Bleeding  The patient's primary symptoms include vaginal bleeding. The patient's pertinent negatives include no pelvic pain. This is a new problem. The current episode started yesterday. The problem occurs constantly. The problem has been gradually worsening. The patient is experiencing no pain. She is pregnant. Pertinent negatives include no chills, dysuria, fever, frequency, nausea, urgency or vomiting. The vaginal bleeding is spotting (mostly brown and light pink. ). She has not been passing clots. She has not been passing tissue. Nothing aggravates the symptoms. She has tried nothing for the symptoms. Sexual activity: patient denies intercourse in the last 24-48 hours.  Her menstrual history has been regular (LMP 09/10/16).   Past Medical History:  Diagnosis Date  . Anxiety   . Depression   . IBS (irritable bowel syndrome)   . Irritable bowel syndrome   . Neuromuscular disorder ([place])   . Other preterm infants, unspecified (weight)(765.10)   . Scoliosis (and kyphoscoliosis), idiopathic   . Urinary tract infection, site not specified   . UTI (urinary tract infection)     Past Surgical History:  Procedure Laterality Date  . WISDOM TOOTH EXTRACTION  2010    Family History  Problem Relation Age of Onset  . Mental illness Sister   . Hyperlipidemia Mother   . Alcohol abuse Father   . Arthritis Maternal Grandmother   . Diabetes Maternal Grandmother   . Cancer Paternal Grandfather     Social History  Substance Use Topics  . Smoking status: Never Smoker  . Smokeless tobacco: Never Used  . Alcohol use Yes     Comment: Socially    Allergies:  Allergies  Allergen Reactions  . Cephalexin     REACTION: Rash all over just before finishing the course last time.  .  Nitrofurantoin     REACTION: rash and itching    Prescriptions Prior to Admission  Medication Sig Dispense Refill Last Dose  . ALPRAZolam (XANAX) 0.5 MG tablet Take 1 tablet (0.5 mg total) by mouth 2 (two) times daily as needed for anxiety. 60 tablet 1 Taking  . diclofenac (VOLTAREN) 75 MG EC tablet Take 1 tablet (75 mg total) by mouth 2 (two) times daily. 20 tablet 0 Taking  . meloxicam (MOBIC) 15 MG tablet Take 1 tablet (15 mg total) by mouth daily. 30 tablet 1 Taking  . ORSYTHIA 0.1-20 MG-MCG per tablet    Taking  . tiZANidine (ZANAFLEX) 4 MG capsule Take 1 capsule (4 mg total) by mouth 3 (three) times daily. 30 capsule 0 Taking    Review of Systems  Constitutional: Negative for chills and fever.  Gastrointestinal: Negative for nausea and vomiting.  Genitourinary: Positive for vaginal bleeding. Negative for dysuria, frequency, pelvic pain and urgency.   Physical Exam   Blood pressure 135/86, pulse (!) 117, temperature 98.3 F (36.8 C), resp. rate 18, height 5\' 5"  (1.651 m), weight 126 lb (57.2 kg), last menstrual period 09/10/2016.  Physical Exam  Nursing note and vitals reviewed. Constitutional: She is oriented to person, place, and time. She appears well-developed and well-nourished. No distress.  HENT:  Head: Normocephalic.  Cardiovascular: Normal rate.   Respiratory: Effort normal.  GI: Soft. There is no tenderness. There is no rebound.  Neurological: She is alert and oriented to person, place,  and time.  Skin: Skin is warm and dry.  Psychiatric: She has a normal mood and affect.    Results for orders placed or performed during the hospital encounter of 11/07/16 (from the past 24 hour(s))  Urinalysis, Routine w reflex microscopic     Status: Abnormal   Collection Time: 11/07/16 11:05 AM  Result Value Ref Range   Color, Urine STRAW (A) YELLOW   APPearance CLEAR CLEAR   Specific Gravity, Urine 1.002 (L) 1.005 - 1.030   pH 7.0 5.0 - 8.0   Glucose, UA NEGATIVE NEGATIVE  mg/dL   Hgb urine dipstick NEGATIVE NEGATIVE   Bilirubin Urine NEGATIVE NEGATIVE   Ketones, ur NEGATIVE NEGATIVE mg/dL   Protein, ur NEGATIVE NEGATIVE mg/dL   Nitrite NEGATIVE NEGATIVE   Leukocytes, UA NEGATIVE NEGATIVE  Pregnancy, urine POC     Status: Abnormal   Collection Time: 11/07/16 11:13 AM  Result Value Ref Range   Preg Test, Ur POSITIVE (A) NEGATIVE  CBC     Status: None   Collection Time: 11/07/16 12:38 PM  Result Value Ref Range   WBC 6.1 4.0 - 10.5 K/uL   RBC 4.07 3.87 - 5.11 MIL/uL   Hemoglobin 13.3 12.0 - 15.0 g/dL   HCT 38.1 36.0 - 46.0 %   MCV 93.6 78.0 - 100.0 fL   MCH 32.7 26.0 - 34.0 pg   MCHC 34.9 30.0 - 36.0 g/dL   RDW 12.3 11.5 - 15.5 %   Platelets 334 150 - 400 K/uL  hCG, quantitative, pregnancy     Status: Abnormal   Collection Time: 11/07/16 12:38 PM  Result Value Ref Range   hCG, Beta Chain, Quant, S 6,028 (H) <5 mIU/mL  ABO/Rh     Status: None (Preliminary result)   Collection Time: 11/07/16 12:38 PM  Result Value Ref Range   ABO/RH(D) O POS    US Ob Comp Less 14 Wks  Result Date: 11/07/2016 CLINICAL DATA:  Vaginal bleeding in first trimester pregnancy EXAM: OBSTETRIC <14 WK [place] AND TRANSVAGINAL OB US TECHNIQUE: Both transabdominal and transvaginal ultrasound examinations were performed for complete evaluation of the gestation as well as the maternal uterus, adnexal regions, and pelvic cul-de-sac. Transvaginal technique was performed to assess early pregnancy. COMPARISON:  None. FINDINGS: Intrauterine gestational sac: Single Yolk sac:  Visualized. Embryo:  Visualized. Cardiac Activity: Visualized. Heart Rate: 92  bpm CRL:  1.9  mm   6 w   1 d                  [place] EDC: 07/02/2017 Subchorionic hemorrhage:  Nonvisualized Maternal uterus/adnexae: RIGHT ovary measures 2.2 x 3.1 x 2.0 cm and contains a small corpus luteal cyst. LEFT ovary normal size and morphology 2.6 x 1.2 x 2.4 cm. No free pelvic fluid or adnexal masses. IMPRESSION: Single live intrauterine  gestation at 6 weeks 1 day EGA by crown-rump length. No acute abnormalities. Electronically Signed   By: Lavonia Dana M.D.   On: 11/07/2016 13:42   US Ob Transvaginal  Result Date: 11/07/2016 CLINICAL DATA:  Vaginal bleeding in first trimester pregnancy EXAM: OBSTETRIC <14 WK [place] AND TRANSVAGINAL OB US TECHNIQUE: Both transabdominal and transvaginal ultrasound examinations were performed for complete evaluation of the gestation as well as the maternal uterus, adnexal regions, and pelvic cul-de-sac. Transvaginal technique was performed to assess early pregnancy. COMPARISON:  None. FINDINGS: Intrauterine gestational sac: Single Yolk sac:  Visualized. Embryo:  Visualized. Cardiac Activity: Visualized. Heart Rate: 92  bpm CRL:  1.9  mm   6 w   1 d                  [place] EDC: 07/02/2017 Subchorionic hemorrhage:  Nonvisualized Maternal uterus/adnexae: RIGHT ovary measures 2.2 x 3.1 x 2.0 cm and contains a small corpus luteal cyst. LEFT ovary normal size and morphology 2.6 x 1.2 x 2.4 cm. No free pelvic fluid or adnexal masses. IMPRESSION: Single live intrauterine gestation at 6 weeks 1 day EGA by crown-rump length. No acute abnormalities. Electronically Signed   By: Lavonia Dana M.D.   On: 11/07/2016 13:42   MAU Course  Procedures  MDM DW Dr. Willis [place] ok for dc home. OK to call the office with future concerns.   Assessment and Plan   1. [redacted] weeks gestation of pregnancy   2. Intrauterine pregnancy    DC home Comfort measures reviewed  1st Trimester precautions  Bleeding precautions RX: none   Return to MAU as needed FU with OB as planned  Follow-up Information    Bovard-Stuckert, Jody, MD Follow up.   Specialty:  Obstetrics and Gynecology Contact information: [place] [address] [phone]           Marcille Buffy 11/07/2016, 11:36 AM

## 2016-11-07 NOTE — MAU Note (Signed)
Pt presents to MAU with complaints of vaginal spotting when she wipes that started around 10pm last night that is getting heavier throughout the day. Called office and they recommended her come here

## 2016-11-07 NOTE — Discharge Instructions (Signed)
First Trimester of Pregnancy The first trimester of pregnancy is from week 1 until the end of week 13 (months 1 through 3). A week after a sperm fertilizes an egg, the egg will implant on the wall of the uterus. This embryo will begin to develop into a baby. Genes from you and your partner will form the baby. The female genes will determine whether the baby will be a boy or a girl. At 6-8 weeks, the eyes and face will be formed, and the heartbeat can be seen on ultrasound. At the end of 12 weeks, all the baby's organs will be formed. Now that you are pregnant, you will want to do everything you can to have a healthy baby. Two of the most important things are to get good prenatal care and to follow your health care provider's instructions. Prenatal care is all the medical care you receive before the baby's birth. This care will help prevent, find, and treat any problems during the pregnancy and childbirth. Body changes during your first trimester Your body goes through many changes during pregnancy. The changes vary from woman to woman.  You may gain or lose a couple of pounds at first.  You may feel sick to your stomach (nauseous) and you may throw up (vomit). If the vomiting is uncontrollable, call your health care provider.  You may tire easily.  You may develop headaches that can be relieved by medicines. All medicines should be approved by your health care provider.  You may urinate more often. Painful urination may mean you have a bladder infection.  You may develop heartburn as a result of your pregnancy.  You may develop constipation because certain hormones are causing the muscles that push stool through your intestines to slow down.  You may develop hemorrhoids or swollen veins (varicose veins).  Your breasts may begin to grow larger and become tender. Your nipples may stick out more, and the tissue that surrounds them (areola) may become darker.  Your gums may bleed and may be  sensitive to brushing and flossing.  Dark spots or blotches (chloasma, mask of pregnancy) may develop on your face. This will likely fade after the baby is born.  Your menstrual periods will stop.  You may have a loss of appetite.  You may develop cravings for certain kinds of food.  You may have changes in your emotions from day to day, such as being excited to be pregnant or being concerned that something may go wrong with the pregnancy and baby.  You may have more vivid and strange dreams.  You may have changes in your hair. These can include thickening of your hair, rapid growth, and changes in texture. Some women also have hair loss during or after pregnancy, or hair that feels dry or thin. Your hair will most likely return to normal after your baby is born.  What to expect at prenatal visits During a routine prenatal visit:  You will be weighed to make sure you and the baby are growing normally.  Your blood pressure will be taken.  Your abdomen will be measured to track your baby's growth.  The fetal heartbeat will be listened to between weeks 10 and 14 of your pregnancy.  Test results from any previous visits will be discussed.  Your health care provider may ask you:  How you are feeling.  If you are feeling the baby move.  If you have had any abnormal symptoms, such as leaking fluid, bleeding, severe headaches,   or abdominal cramping.  If you are using any tobacco products, including cigarettes, chewing tobacco, and electronic cigarettes.  If you have any questions.  Other tests that may be performed during your first trimester include:  Blood tests to find your blood type and to check for the presence of any previous infections. The tests will also be used to check for low iron levels (anemia) and protein on red blood cells (Rh antibodies). Depending on your risk factors, or if you previously had diabetes during pregnancy, you may have tests to check for high blood  sugar that affects pregnant women (gestational diabetes).  Urine tests to check for infections, diabetes, or protein in the urine.  An ultrasound to confirm the proper growth and development of the baby.  Fetal screens for spinal cord problems (spina bifida) and Down syndrome.  HIV (human immunodeficiency virus) testing. Routine prenatal testing includes screening for HIV, unless you choose not to have this test.  You may need other tests to make sure you and the baby are doing well.  Follow these instructions at home: Medicines  Follow your health care provider's instructions regarding medicine use. Specific medicines may be either safe or unsafe to take during pregnancy.  Take a prenatal vitamin that contains at least 600 micrograms (mcg) of folic acid.  If you develop constipation, try taking a stool softener if your health care provider approves. Eating and drinking  Eat a balanced diet that includes fresh fruits and vegetables, whole grains, good sources of protein such as meat, eggs, or tofu, and low-fat dairy. Your health care provider will help you determine the amount of weight gain that is right for you.  Avoid raw meat and uncooked cheese. These carry germs that can cause birth defects in the baby.  Eating four or five small meals rather than three large meals a day may help relieve nausea and vomiting. If you start to feel nauseous, eating a few soda crackers can be helpful. Drinking liquids between meals, instead of during meals, also seems to help ease nausea and vomiting.  Limit foods that are high in fat and processed sugars, such as fried and sweet foods.  To prevent constipation: ? Eat foods that are high in fiber, such as fresh fruits and vegetables, whole grains, and beans. ? Drink enough fluid to keep your urine clear or pale yellow. Activity  Exercise only as directed by your health care provider. Most women can continue their usual exercise routine during  pregnancy. Try to exercise for 30 minutes at least 5 days a week. Exercising will help you: ? Control your weight. ? Stay in shape. ? Be prepared for labor and delivery.  Experiencing pain or cramping in the lower abdomen or lower back is a good sign that you should stop exercising. Check with your health care provider before continuing with normal exercises.  Try to avoid standing for long periods of time. Move your legs often if you must stand in one place for a long time.  Avoid heavy lifting.  Wear low-heeled shoes and practice good posture.  You may continue to have sex unless your health care provider tells you not to. Relieving pain and discomfort  Wear a good support bra to relieve breast tenderness.  Take warm sitz baths to soothe any pain or discomfort caused by hemorrhoids. Use hemorrhoid cream if your health care provider approves.  Rest with your legs elevated if you have leg cramps or low back pain.  If you develop   varicose veins in your legs, wear support hose. Elevate your feet for 15 minutes, 3-4 times a day. Limit salt in your diet. Prenatal care  Schedule your prenatal visits by the twelfth week of pregnancy. They are usually scheduled monthly at first, then more often in the last 2 months before delivery.  Write down your questions. Take them to your prenatal visits.  Keep all your prenatal visits as told by your health care provider. This is important. Safety  Wear your seat belt at all times when driving.  Make a list of emergency phone numbers, including numbers for family, friends, the hospital, and police and fire departments. General instructions  Ask your health care provider for a referral to a local prenatal education class. Begin classes no later than the beginning of month 6 of your pregnancy.  Ask for help if you have counseling or nutritional needs during pregnancy. Your health care provider can offer advice or refer you to specialists for help  with various needs.  Do not use hot tubs, steam rooms, or saunas.  Do not douche or use tampons or scented sanitary pads.  Do not cross your legs for long periods of time.  Avoid cat litter boxes and soil used by cats. These carry germs that can cause birth defects in the baby and possibly loss of the fetus by miscarriage or stillbirth.  Avoid all smoking, herbs, alcohol, and medicines not prescribed by your health care provider. Chemicals in these products affect the formation and growth of the baby.  Do not use any products that contain nicotine or tobacco, such as cigarettes and e-cigarettes. If you need help quitting, ask your health care provider. You may receive counseling support and other resources to help you quit.  Schedule a dentist appointment. At home, brush your teeth with a soft toothbrush and be gentle when you floss. Contact a health care provider if:  You have dizziness.  You have mild pelvic cramps, pelvic pressure, or nagging pain in the abdominal area.  You have persistent nausea, vomiting, or diarrhea.  You have a bad smelling vaginal discharge.  You have pain when you urinate.  You notice increased swelling in your face, hands, legs, or ankles.  You are exposed to fifth disease or chickenpox.  You are exposed to German measles (rubella) and have never had it. Get help right away if:  You have a fever.  You are leaking fluid from your vagina.  You have spotting or bleeding from your vagina.  You have severe abdominal cramping or pain.  You have rapid weight gain or loss.  You vomit blood or material that looks like coffee grounds.  You develop a severe headache.  You have shortness of breath.  You have any kind of trauma, such as from a fall or a car accident. Summary  The first trimester of pregnancy is from week 1 until the end of week 13 (months 1 through 3).  Your body goes through many changes during pregnancy. The changes vary from  woman to woman.  You will have routine prenatal visits. During those visits, your health care provider will examine you, discuss any test results you may have, and talk with you about how you are feeling. This information is not intended to replace advice given to you by your health care provider. Make sure you discuss any questions you have with your health care provider. Document Released: 01/23/2001 Document Revised: 01/11/2016 Document Reviewed: 01/11/2016 Elsevier Interactive Patient Education  2017 Else[employer]   Inc[employer]  

## 2016-12-28 ENCOUNTER — Other Ambulatory Visit: Payer: Self-pay | Admitting: Family Medicine

## 2016-12-28 DIAGNOSIS — N979 Female infertility, unspecified: Secondary | ICD-10-CM

## 2017-01-10 ENCOUNTER — Encounter: Payer: BC Managed Care – PPO | Admitting: Family Medicine

## 2017-01-14 ENCOUNTER — Ambulatory Visit (INDEPENDENT_AMBULATORY_CARE_PROVIDER_SITE_OTHER): Payer: BC Managed Care – PPO | Admitting: Family Medicine

## 2017-01-14 ENCOUNTER — Encounter: Payer: Self-pay | Admitting: Family Medicine

## 2017-01-14 ENCOUNTER — Other Ambulatory Visit: Payer: Self-pay

## 2017-01-14 VITALS — BP 114/74 | HR 94 | Temp 98.1°F | Resp 98 | Ht 65.0 in | Wt 128.5 lb

## 2017-01-14 DIAGNOSIS — Z91018 Allergy to other foods: Secondary | ICD-10-CM

## 2017-01-14 DIAGNOSIS — Z0000 Encounter for general adult medical examination without abnormal findings: Secondary | ICD-10-CM

## 2017-01-14 NOTE — Patient Instructions (Signed)
Follow up in 1 year or as needed We'll call you with your Allergy appt Continue to avoid tree nuts until you have formal testing done Keep up the good work!  You look great! Call with any questions or concerns Enjoy the mountains!!! Happy Holidays!!!

## 2017-01-14 NOTE — Progress Notes (Signed)
   Subjective:    Patient ID: Karen Booker, female    DOB: 02-08-1991, 26 y.o.   MRN: [id number]  HPI CPE- UTD on Tdap, flu.  UTD w/ GYN.  No need for blood work today as pt has had labs at [facility] and otherwise very low risk.   Review of Systems Patient reports no vision/ hearing changes, adenopathy,fever, weight change,  persistant/recurrent hoarseness , swallowing issues, chest pain, palpitations, edema, persistant/recurrent cough, hemoptysis, dyspnea (rest/exertional/paroxysmal nocturnal), gastrointestinal bleeding (melena, rectal bleeding), abdominal pain, significant heartburn, bowel changes, GU symptoms (dysuria, hematuria, incontinence), Gyn symptoms (abnormal  bleeding, pain),  syncope, focal weakness, memory loss, numbness & tingling, skin/hair/nail changes, abnormal bruising or bleeding, anxiety, or depression.     Objective:   Physical Exam General Appearance:    Alert, cooperative, no distress, appears stated age  Head:    Normocephalic, without obvious abnormality, atraumatic  Eyes:    PERRL, conjunctiva/corneas clear, EOM's intact, fundi    benign, both eyes  Ears:    Normal TM's and external ear canals, both ears  Nose:   Nares normal, septum midline, mucosa normal, no drainage    or sinus tenderness  Throat:   Lips, mucosa, and tongue normal; teeth and gums normal  Neck:   Supple, symmetrical, trachea midline, no adenopathy;    Thyroid: no enlargement/tenderness/nodules  Back:     Symmetric, no curvature, ROM normal, no CVA tenderness  Lungs:     Clear to auscultation bilaterally, respirations unlabored  Chest Wall:    No tenderness or deformity   Heart:    Regular rate and rhythm, S1 and S2 normal, no murmur, rub   or gallop  Breast Exam:    Deferred to GYN  Abdomen:     Soft, non-tender, bowel sounds active all four quadrants,    no masses, no organomegaly  Genitalia:    Deferred to GYN  Rectal:    Extremities:   Extremities normal, atraumatic, no cyanosis or edema    Pulses:   2+ and symmetric all extremities  Skin:   Skin color, texture, turgor normal, no rashes or lesions  Lymph nodes:   Cervical, supraclavicular, and axillary nodes normal  Neurologic:   CNII-XII intact, normal strength, sensation and reflexes    throughout          Assessment & Plan:

## 2017-01-14 NOTE — Assessment & Plan Note (Signed)
Pt's PE WNL.  UTD on GYN.  No need for labs as the labs in 2016 were WNL and very low risk (total cholesterol 139, LDL 52, HDL 68).  No need to repeat.  Pt has also had labs done at GYN.  Anticipatory guidance provided.

## 2017-01-23 ENCOUNTER — Encounter: Payer: Self-pay | Admitting: Family Medicine

## 2017-02-12 NOTE — L&D Delivery Note (Signed)
Delivery Note  SVD viable female Apgars 9,9 over 2nd degree MLE.  Placenta delivered spontaneously intact with 3VC. Repair with 2-0 Chromic with good support and hemostasis noted.  R/V exam confirms.  PH art was sent.   Mother and baby to couplet care and are doing well.  EBL 300cc  Louretta Shorten, MD

## 2017-02-28 LAB — OB RESULTS CONSOLE RUBELLA ANTIBODY, IGM: Rubella: IMMUNE

## 2017-02-28 LAB — OB RESULTS CONSOLE RPR: RPR: NONREACTIVE

## 2017-02-28 LAB — OB RESULTS CONSOLE GC/CHLAMYDIA
Chlamydia: NEGATIVE
Gonorrhea: NEGATIVE

## 2017-02-28 LAB — OB RESULTS CONSOLE HIV ANTIBODY (ROUTINE TESTING): HIV: NONREACTIVE

## 2017-02-28 LAB — OB RESULTS CONSOLE HEPATITIS B SURFACE ANTIGEN: Hepatitis B Surface Ag: NEGATIVE

## 2017-02-28 LAB — OB RESULTS CONSOLE ANTIBODY SCREEN: Antibody Screen: NEGATIVE

## 2017-02-28 LAB — OB RESULTS CONSOLE ABO/RH: RH Type: POSITIVE

## 2017-03-01 ENCOUNTER — Ambulatory Visit: Payer: Self-pay | Admitting: Allergy

## 2017-09-16 ENCOUNTER — Encounter (HOSPITAL_COMMUNITY): Payer: Self-pay | Admitting: *Deleted

## 2017-09-16 ENCOUNTER — Inpatient Hospital Stay (HOSPITAL_COMMUNITY)
Admission: AD | Admit: 2017-09-16 | Discharge: 2017-09-16 | Disposition: A | Discharge status: Home / Self Care | Payer: BC Managed Care – PPO | Source: Ambulatory Visit | Attending: Obstetrics and Gynecology | Admitting: Obstetrics and Gynecology

## 2017-09-16 DIAGNOSIS — Z711 Person with feared health complaint in whom no diagnosis is made: Secondary | ICD-10-CM

## 2017-09-16 DIAGNOSIS — Z3689 Encounter for other specified antenatal screening: Secondary | ICD-10-CM

## 2017-09-16 DIAGNOSIS — O26853 Spotting complicating pregnancy, third trimester: Secondary | ICD-10-CM | POA: Diagnosis not present

## 2017-09-16 DIAGNOSIS — O4690 Antepartum hemorrhage, unspecified, unspecified trimester: Secondary | ICD-10-CM

## 2017-09-16 DIAGNOSIS — N939 Abnormal uterine and vaginal bleeding, unspecified: Secondary | ICD-10-CM | POA: Diagnosis present

## 2017-09-16 DIAGNOSIS — Z3A36 36 weeks gestation of pregnancy: Secondary | ICD-10-CM | POA: Diagnosis not present

## 2017-09-16 DIAGNOSIS — O4693 Antepartum hemorrhage, unspecified, third trimester: Secondary | ICD-10-CM

## 2017-09-16 LAB — URINALYSIS, ROUTINE W REFLEX MICROSCOPIC
Bilirubin Urine: NEGATIVE
Glucose, UA: NEGATIVE mg/dL
Hgb urine dipstick: NEGATIVE
Ketones, ur: NEGATIVE mg/dL
Leukocytes, UA: NEGATIVE
Nitrite: NEGATIVE
Protein, ur: NEGATIVE mg/dL
Specific Gravity, Urine: 1.004 — ABNORMAL LOW (ref 1.005–1.030)
pH: 7 (ref 5.0–8.0)

## 2017-09-16 LAB — OB RESULTS CONSOLE GBS: GBS: NEGATIVE

## 2017-09-16 NOTE — MAU Provider Note (Signed)
S: Ms. Karen Booker is a 27 y.o. G2P0 at [redacted]w[redacted]d  who presents to MAU today complaining of vaginal bleeding. She reports dark red vaginal spotting that has been occurring since this afternoon. She reports having a prenatal appointment at 1600 and having a SVE where she was noted to be closed. She states bleeding has been occurring since appointment and occurs when she wipes. She denies having to wear pad or panty liner. She denies abdominal pain or contractions. +FM. Denies LOF.   O: BP 116/70 (BP Location: Right Arm)   Pulse 77   Temp 98.4 F (36.9 C) (Oral)   Resp 18   Ht 5\' 5"  (1.651 m)   Wt 163 lb 0.6 oz (74 kg)   BMI 27.13 kg/m  GENERAL: Well-developed, well-nourished female in no acute distress.  HEAD: Normocephalic, atraumatic.  CHEST: Normal effort of breathing, regular heart rate ABDOMEN: Soft, nontender, gravid appropriate for gestational age  PELVIC: deferred due to recent examination in the office   Fetal Monitoring: Baseline: 135 Variability: moderate  Accelerations: present  Decelerations: none  Contractions: 1 UC on the monitor over 20 minutes, mild by palpation- patient reports she did not feel contractions   Results for orders placed or performed during the hospital encounter of 09/16/17 (from the past 24 hour(s))  OB RESULT CONSOLE Group B Strep     Status: None   Collection Time: 09/16/17 12:00 AM  Result Value Ref Range   GBS Negative   Urinalysis, Routine w reflex microscopic     Status: Abnormal   Collection Time: 09/16/17 11:01 PM  Result Value Ref Range   Color, Urine STRAW (A) YELLOW   APPearance CLEAR CLEAR   Specific Gravity, Urine 1.004 (L) 1.005 - 1.030   pH 7.0 5.0 - 8.0   Glucose, UA NEGATIVE NEGATIVE mg/dL   Hgb urine dipstick NEGATIVE NEGATIVE   Bilirubin Urine NEGATIVE NEGATIVE   Ketones, ur NEGATIVE NEGATIVE mg/dL   Protein, ur NEGATIVE NEGATIVE mg/dL   Nitrite NEGATIVE NEGATIVE   Leukocytes, UA NEGATIVE NEGATIVE   A: SIUP at [redacted]w[redacted]d  NST  reactive  Vaginal spotting during pregnancy- cervix friability from cervical exam  P: Labor precautions and reasons to return to MAU  Follow up as scheduled for prenatal appointment next Tuesday  Return to MAU as needed  Return to MAU for increased vaginal bleeding like a period, labor eval, ROM, and/or decreased fetal movement   Lajean Manes, CNM 09/16/2017 11:16 PM

## 2017-09-16 NOTE — Discharge Instructions (Signed)
Reasons to return to MAU:  1.  Contractions are  3-4 minutes apart or less, each last 1 minute, these have been going on for 1-2 hours, and you cannot walk or talk during them 2.  You have a large gush of fluid, or a trickle of fluid that will not stop and you have to wear a pad 3.  You have bleeding that is bright red, heavier than spotting--like menstrual bleeding (spotting can be normal in early labor or after a check of your cervix) 4.  You do not feel the baby moving like he/she normally does

## 2017-09-16 NOTE — MAU Note (Signed)
Pt presents to MAU with complaints of vaginal bleeding. Pt was seen  In the office today and had a SVE.  describes bleeding as dark red and when wipes, + FM, last intercourse 1 week ago.  Denies other vaginal discharge

## 2017-10-03 ENCOUNTER — Encounter (HOSPITAL_COMMUNITY): Payer: Self-pay | Admitting: *Deleted

## 2017-10-03 ENCOUNTER — Telehealth (HOSPITAL_COMMUNITY): Payer: Self-pay | Admitting: *Deleted

## 2017-10-03 NOTE — Telephone Encounter (Signed)
Preadmission screen  

## 2017-10-06 ENCOUNTER — Encounter (HOSPITAL_COMMUNITY): Payer: Self-pay | Admitting: *Deleted

## 2017-10-06 ENCOUNTER — Other Ambulatory Visit: Payer: Self-pay

## 2017-10-06 ENCOUNTER — Inpatient Hospital Stay (HOSPITAL_COMMUNITY)
Admission: AD | Admit: 2017-10-06 | Discharge: 2017-10-06 | Disposition: A | Discharge status: Home / Self Care | Payer: BC Managed Care – PPO | Source: Ambulatory Visit | Attending: Obstetrics & Gynecology | Admitting: Obstetrics & Gynecology

## 2017-10-06 DIAGNOSIS — Z3A39 39 weeks gestation of pregnancy: Secondary | ICD-10-CM | POA: Insufficient documentation

## 2017-10-06 DIAGNOSIS — N898 Other specified noninflammatory disorders of vagina: Secondary | ICD-10-CM | POA: Diagnosis present

## 2017-10-06 DIAGNOSIS — O288 Other abnormal findings on antenatal screening of mother: Secondary | ICD-10-CM

## 2017-10-06 DIAGNOSIS — O26893 Other specified pregnancy related conditions, third trimester: Secondary | ICD-10-CM | POA: Diagnosis present

## 2017-10-06 LAB — AMNISURE RUPTURE OF MEMBRANE (ROM) NOT AT ARMC: Amnisure ROM: NEGATIVE

## 2017-10-06 LAB — POCT FERN TEST: POCT Fern Test: NEGATIVE

## 2017-10-06 NOTE — MAU Note (Signed)
I have communicated with Dr. Lynnette Caffey and reviewed vital signs:  Vitals:   10/06/17 1931  BP: 119/72  Pulse: 84  Resp: 18  Temp: 98 F (36.7 C)  SpO2: 98%    Vaginal exam:  Dilation: 1 Effacement (%): 90 Station: -2 Presentation: Vertex Exam by:: B Pooja Camuso  RN,   Also reviewed contraction pattern and that non-stress test is reactive.  It has been documented that patient is contracting every 2-6 minutes with minimal cervical change since her last prenatal appointment not indicating active labor.  SROM was ruled out with neg fern and amnisure. Patient denies any other complaints.  Based on this report provider has given order for discharge.  A discharge order and diagnosis entered by a provider.   Labor discharge instructions reviewed with patient.

## 2017-10-06 NOTE — MAU Provider Note (Signed)
History   [id number]   Chief Complaint  Patient presents with  . Rupture of Membranes    HPI Karen Booker is a 27 y.o. female  G2P0010 @39 .2 wks here with report of two small gushes of fluid around 1800. Leaking of fluid has continued. Pt reports irregular contractions. She denies vaginal bleeding. Last intercourse was not recently. She reports good fetal movement. All other systems negative.    No LMP recorded. Patient is pregnant.  OB History  Gravida Para Term Preterm AB Living  2       1    SAB TAB Ectopic Multiple Live Births  1            # Outcome Date GA Lbr Len/2nd Weight Sex Delivery Anes PTL Lv  2 Current           1 SAB             Past Medical History:  Diagnosis Date  . Anxiety   . Depression   . IBS (irritable bowel syndrome)   . Irritable bowel syndrome   . Neuromuscular disorder ([place])   . Other preterm infants, unspecified (weight)(765.10)   . Scoliosis (and kyphoscoliosis), idiopathic   . Urinary tract infection, site not specified   . UTI (urinary tract infection)     Family History  Problem Relation Age of Onset  . Mental illness Sister   . Hyperlipidemia Mother   . Alcohol abuse Father   . Arthritis Maternal Grandmother   . Diabetes Maternal Grandmother   . Cancer Paternal Grandfather     Social History   Socioeconomic History  . Marital status: Married    Spouse name: Not on file  . Number of children: Not on file  . Years of education: Not on file  . Highest education level: Not on file  Occupational History  . Occupation: Ship broker at [employer]  . Financial resource strain: Not on file  . Food insecurity:    Worry: Not on file    Inability: Not on file  . Transportation needs:    Medical: Not on file    Non-medical: Not on file  Tobacco Use  . Smoking status: Never Smoker  . Smokeless tobacco: Never Used  Substance and Sexual Activity  . Alcohol use: Yes    Comment: Socially  . Drug use: No  .  Sexual activity: Yes    Birth control/protection: Pill  Lifestyle  . Physical activity:    Days per week: Not on file    Minutes per session: Not on file  . Stress: Not on file  Relationships  . Social connections:    Talks on phone: Not on file    Gets together: Not on file    Attends religious service: Not on file    Active member of club or organization: Not on file    Attends meetings of clubs or organizations: Not on file    Relationship status: Not on file  Other Topics Concern  . Not on file  Social History Narrative  . Not on file    Allergies  Allergen Reactions  . Cephalexin     REACTION: Rash all over just before finishing the course last time.  . Nitrofurantoin     REACTION: rash and itching  . Other Hives and Other (See Comments)    Tree nuts- hives in mouth    No current facility-administered medications on file prior to encounter.    Current  Outpatient Medications on File Prior to Encounter  Medication Sig Dispense Refill  . acetaminophen (TYLENOL) 500 MG tablet Take 500 mg by mouth every 6 (six) hours as needed for mild pain or headache.    . Doxylamine-Pyridoxine (DICLEGIS PO) Take by mouth.    . folic acid (FOLVITE) 1 MG tablet Take 1 mg by mouth daily.    . Prenatal Vit-Fe Fumarate-FA (PRENATAL MULTIVITAMIN) TABS tablet Take 1 tablet by mouth daily at 12 noon.    . ranitidine (ZANTAC) 150 MG capsule Take 150 mg by mouth 2 (two) times daily.    . diphenhydrAMINE (BENADRYL) 25 mg capsule Take 25 mg by mouth every 4 (four) hours as needed for allergies.    Marland Kitchen LAVENDER OIL PO Take 1 capsule by mouth daily.    . Omega-3 Fatty Acids (FISH OIL) 1000 MG CAPS Take 1 capsule by mouth daily.       Review of Systems  Gastrointestinal: Negative for abdominal pain.  Genitourinary: Positive for vaginal discharge. Negative for vaginal bleeding.     Physical Exam   Vitals:   10/06/17 1931 10/06/17 2120  BP: 119/72 114/70  Pulse: 84 85  Resp: 18 18  Temp: 98  F (36.7 C)   TempSrc: Oral   SpO2: 98%   Weight: 75.3 kg   Height: 5\' 5"  (1.651 m)     Physical Exam  Constitutional: She is oriented to person, place, and time. She appears well-developed and well-nourished. No distress.  HENT:  Head: Normocephalic and atraumatic.  Neck: Normal range of motion.  Cardiovascular: Normal rate.  Respiratory: Effort normal. No respiratory distress.  Genitourinary:  Genitourinary Comments: SSE: no pool, +mucous, fern neg  Musculoskeletal: Normal range of motion.  Neurological: She is alert and oriented to person, place, and time.  Skin: Skin is warm and dry.  Psychiatric: She has a normal mood and affect.  EFM: 125 bpm, mod variability, + accels, no decels Toco: irregular  Results for orders placed or performed during the hospital encounter of 10/06/17 (from the past 24 hour(s))  POCT fern test     Status: Normal   Collection Time: 10/06/17  8:10 PM  Result Value Ref Range   POCT Fern Test Negative = intact amniotic membranes   Amnisure rupture of membrane (rom)not at [facility]     Status: None   Collection Time: 10/06/17  8:41 PM  Result Value Ref Range   Amnisure ROM NEGATIVE     MAU Course  Procedures  MDM Labs ordered and reviewed. No evidence of SROM. Stable for discharge home.  Assessment and Plan   1. [redacted] weeks gestation of pregnancy   2. NST (non-stress test) nonreactive   3. Vaginal discharge during pregnancy in third trimester    Discharge home Follow up in OB office as scheduled Labor precautions  Allergies as of 10/06/2017      Reactions   Cephalexin    REACTION: Rash all over just before finishing the course last time.   Nitrofurantoin    REACTION: rash and itching   Other Hives, Other (See Comments)   Tree nuts- hives in mouth      Medication List    TAKE these medications   acetaminophen 500 MG tablet Commonly known as:  TYLENOL Take 500 mg by mouth every 6 (six) hours as needed for mild pain or headache.    BENADRYL 25 mg capsule Generic drug:  diphenhydrAMINE Take 25 mg by mouth every 4 (four) hours as needed for allergies.  DICLEGIS PO Take by mouth.   Fish Oil 1000 MG Caps Take 1 capsule by mouth daily.   folic acid 1 MG tablet Commonly known as:  FOLVITE Take 1 mg by mouth daily.   LAVENDER OIL PO Take 1 capsule by mouth daily.   prenatal multivitamin Tabs tablet Take 1 tablet by mouth daily at 12 noon.   ranitidine 150 MG capsule Commonly known as:  ZANTAC Take 150 mg by mouth 2 (two) times daily.      Julianne Handler, CNM 10/06/2017 9:31 PM

## 2017-10-06 NOTE — Discharge Instructions (Signed)
Braxton Hicks Contractions °Contractions of the uterus can occur throughout pregnancy, but they are not always a sign that you are in labor. You may have practice contractions called Braxton Hicks contractions. These false labor contractions are sometimes confused with true labor. °What are Braxton Hicks contractions? °Braxton Hicks contractions are tightening movements that occur in the muscles of the uterus before labor. Unlike true labor contractions, these contractions do not result in opening (dilation) and thinning of the cervix. Toward the end of pregnancy (32-34 weeks), Braxton Hicks contractions can happen more often and may become stronger. These contractions are sometimes difficult to tell apart from true labor because they can be very uncomfortable. You should not feel embarrassed if you go to the hospital with false labor. °Sometimes, the only way to tell if you are in true labor is for your health care provider to look for changes in the cervix. The health care provider will do a physical exam and may monitor your contractions. If you are not in true labor, the exam should show that your cervix is not dilating and your water has not broken. °If there are other health problems associated with your pregnancy, it is completely safe for you to be sent home with false labor. You may continue to have Braxton Hicks contractions until you go into true labor. °How to tell the difference between true labor and false labor °True labor °· Contractions last 30-70 seconds. °· Contractions become very regular. °· Discomfort is usually felt in the top of the uterus, and it spreads to the lower abdomen and low back. °· Contractions do not go away with walking. °· Contractions usually become more intense and increase in frequency. °· The cervix dilates and gets thinner. °False labor °· Contractions are usually shorter and not as strong as true labor contractions. °· Contractions are usually irregular. °· Contractions  are often felt in the front of the lower abdomen and in the groin. °· Contractions may go away when you walk around or change positions while lying down. °· Contractions get weaker and are shorter-lasting as time goes on. °· The cervix usually does not dilate or become thin. °Follow these instructions at home: °· Take over-the-counter and prescription medicines only as told by your health care provider. °· Keep up with your usual exercises and follow other instructions from your health care provider. °· Eat and drink lightly if you think you are going into labor. °· If Braxton Hicks contractions are making you uncomfortable: °? Change your position from lying down or resting to walking, or change from walking to resting. °? Sit and rest in a tub of warm water. °? Drink enough fluid to keep your urine pale yellow. Dehydration may cause these contractions. °? Do slow and deep breathing several times an hour. °· Keep all follow-up prenatal visits as told by your health care provider. This is important. °Contact a health care provider if: °· You have a fever. °· You have continuous pain in your abdomen. °Get help right away if: °· Your contractions become stronger, more regular, and closer together. °· You have fluid leaking or gushing from your vagina. °· You pass blood-tinged mucus (bloody show). °· You have bleeding from your vagina. °· You have low back pain that you never had before. °· You feel your baby’s head pushing down and causing pelvic pressure. °· Your baby is not moving inside you as much as it used to. °Summary °· Contractions that occur before labor are called Braxton   Hicks contractions, false labor, or practice contractions. °· Braxton Hicks contractions are usually shorter, weaker, farther apart, and less regular than true labor contractions. True labor contractions usually become progressively stronger and regular and they become more frequent. °· Manage discomfort from Braxton Hicks contractions by  changing position, resting in a warm bath, drinking plenty of water, or practicing deep breathing. °This information is not intended to replace advice given to you by your health care provider. Make sure you discuss any questions you have with your health care provider. °Document Released: 06/14/2016 Document Revised: 06/14/2016 Document Reviewed: 06/14/2016 °Elsevier Interactive Patient Education © 2018 [employer] ° °

## 2017-10-06 NOTE — MAU Note (Signed)
Pt states today around 6pm that she had a gush of clear/pinkish fluid. States that a little more came out around 6:30pm, but none since then. Pt reports irregular contractions. Reports good fetal movement.

## 2017-10-06 NOTE — MAU Note (Signed)
Pt had some leaking of fluid @ 1800, 1830 and on her way to MAU.

## 2017-10-07 ENCOUNTER — Inpatient Hospital Stay (HOSPITAL_COMMUNITY)
Admission: AD | Admit: 2017-10-07 | Discharge: 2017-10-10 | DRG: 807 | Disposition: A | Discharge status: Home / Self Care | Payer: BC Managed Care – PPO | Attending: Obstetrics and Gynecology | Admitting: Obstetrics and Gynecology

## 2017-10-07 ENCOUNTER — Encounter (HOSPITAL_COMMUNITY): Payer: Self-pay

## 2017-10-07 DIAGNOSIS — O479 False labor, unspecified: Secondary | ICD-10-CM

## 2017-10-07 DIAGNOSIS — Z3A39 39 weeks gestation of pregnancy: Secondary | ICD-10-CM

## 2017-10-07 NOTE — MAU Note (Signed)
CTX 5 minutes apart for the past hour.  Having some bleeding since exam this AM.  Was 2 cm in the office today.  No LOF.  + FM.

## 2017-10-08 ENCOUNTER — Inpatient Hospital Stay (HOSPITAL_COMMUNITY): Payer: BC Managed Care – PPO | Admitting: Anesthesiology

## 2017-10-08 ENCOUNTER — Encounter (HOSPITAL_COMMUNITY): Payer: Self-pay

## 2017-10-08 DIAGNOSIS — Z3A39 39 weeks gestation of pregnancy: Secondary | ICD-10-CM | POA: Diagnosis not present

## 2017-10-08 DIAGNOSIS — Z3483 Encounter for supervision of other normal pregnancy, third trimester: Secondary | ICD-10-CM | POA: Diagnosis present

## 2017-10-08 LAB — RPR: RPR Ser Ql: NONREACTIVE

## 2017-10-08 LAB — CBC
HCT: 39.5 % (ref 36.0–46.0)
Hemoglobin: 13.7 g/dL (ref 12.0–15.0)
MCH: 34.5 pg — ABNORMAL HIGH (ref 26.0–34.0)
MCHC: 34.7 g/dL (ref 30.0–36.0)
MCV: 99.5 fL (ref 78.0–100.0)
Platelets: 244 10*3/uL (ref 150–400)
RBC: 3.97 MIL/uL (ref 3.87–5.11)
RDW: 12.3 % (ref 11.5–15.5)
WBC: 13.5 10*3/uL — ABNORMAL HIGH (ref 4.0–10.5)

## 2017-10-08 LAB — TYPE AND SCREEN
ABO/RH(D): O POS
Antibody Screen: NEGATIVE

## 2017-10-08 MED ORDER — WITCH HAZEL-GLYCERIN EX PADS
1.0000 "application " | MEDICATED_PAD | CUTANEOUS | Status: DC | PRN
  Administered 2017-10-08 – 2017-10-09 (×2): 1 via TOPICAL

## 2017-10-08 MED ORDER — PHENYLEPHRINE 40 MCG/ML (10ML) SYRINGE FOR IV PUSH (FOR BLOOD PRESSURE SUPPORT)
80.0000 ug | PREFILLED_SYRINGE | INTRAVENOUS | Status: DC | PRN
  Filled 2017-10-08: qty 5

## 2017-10-08 MED ORDER — OXYTOCIN 40 UNITS IN LACTATED RINGERS INFUSION - SIMPLE MED: 1.0000 m[IU]/min | INTRAVENOUS | Status: DC

## 2017-10-08 MED ORDER — SOD CITRATE-CITRIC ACID 500-334 MG/5ML PO SOLN: 30.0000 mL | ORAL | Status: DC | PRN

## 2017-10-08 MED ORDER — EPHEDRINE 5 MG/ML INJ
10.0000 mg | INTRAVENOUS | Status: DC | PRN
  Filled 2017-10-08: qty 2

## 2017-10-08 MED ORDER — DIBUCAINE 1 % RE OINT
1.0000 "application " | TOPICAL_OINTMENT | RECTAL | Status: DC | PRN
  Administered 2017-10-09: 1 via RECTAL
  Filled 2017-10-08: qty 28

## 2017-10-08 MED ORDER — LACTATED RINGERS IV SOLN: 500.0000 mL | Freq: Once | INTRAVENOUS | Status: DC

## 2017-10-08 MED ORDER — OXYTOCIN BOLUS FROM INFUSION
500.0000 mL | Freq: Once | INTRAVENOUS | Status: AC
  Administered 2017-10-08: 500 mL via INTRAVENOUS

## 2017-10-08 MED ORDER — IBUPROFEN 600 MG PO TABS
600.0000 mg | ORAL_TABLET | Freq: Four times a day (QID) | ORAL | Status: DC
  Administered 2017-10-08 – 2017-10-10 (×9): 600 mg via ORAL
  Filled 2017-10-08 (×9): qty 1

## 2017-10-08 MED ORDER — LACTATED RINGERS IV SOLN
INTRAVENOUS | Status: DC
  Administered 2017-10-08: 09:00:00 via INTRAVENOUS

## 2017-10-08 MED ORDER — FENTANYL CITRATE (PF) 100 MCG/2ML IJ SOLN
INTRAMUSCULAR | Status: AC
  Filled 2017-10-08: qty 2

## 2017-10-08 MED ORDER — FENTANYL CITRATE (PF) 100 MCG/2ML IJ SOLN
100.0000 ug | INTRAMUSCULAR | Status: DC | PRN
  Administered 2017-10-08: 100 ug via INTRAVENOUS

## 2017-10-08 MED ORDER — ONDANSETRON HCL 4 MG/2ML IJ SOLN: 4.0000 mg | INTRAMUSCULAR | Status: DC | PRN

## 2017-10-08 MED ORDER — OXYCODONE-ACETAMINOPHEN 5-325 MG PO TABS: 2.0000 | ORAL_TABLET | ORAL | Status: DC | PRN

## 2017-10-08 MED ORDER — FENTANYL 2.5 MCG/ML BUPIVACAINE 1/10 % EPIDURAL INFUSION (WH - ANES)
INTRAMUSCULAR | Status: AC
  Filled 2017-10-08: qty 100

## 2017-10-08 MED ORDER — OXYCODONE-ACETAMINOPHEN 5-325 MG PO TABS
1.0000 | ORAL_TABLET | ORAL | Status: DC | PRN
  Administered 2017-10-08 – 2017-10-10 (×3): 1 via ORAL
  Filled 2017-10-08 (×3): qty 1

## 2017-10-08 MED ORDER — SIMETHICONE 80 MG PO CHEW: 80.0000 mg | CHEWABLE_TABLET | ORAL | Status: DC | PRN

## 2017-10-08 MED ORDER — TETANUS-DIPHTH-ACELL PERTUSSIS 5-2.5-18.5 LF-MCG/0.5 IM SUSP: 0.5000 mL | Freq: Once | INTRAMUSCULAR | Status: DC

## 2017-10-08 MED ORDER — FLEET ENEMA 7-19 GM/118ML RE ENEM: 1.0000 | ENEMA | RECTAL | Status: DC | PRN

## 2017-10-08 MED ORDER — DIPHENHYDRAMINE HCL 50 MG/ML IJ SOLN
12.5000 mg | INTRAMUSCULAR | Status: DC | PRN
  Administered 2017-10-08: 12.5 mg via INTRAVENOUS
  Filled 2017-10-08: qty 1

## 2017-10-08 MED ORDER — BUTORPHANOL TARTRATE 1 MG/ML IJ SOLN: 0.5000 mg | Freq: Once | INTRAMUSCULAR | Status: DC

## 2017-10-08 MED ORDER — LIDOCAINE HCL (PF) 1 % IJ SOLN
30.0000 mL | INTRAMUSCULAR | Status: DC | PRN
  Filled 2017-10-08: qty 30

## 2017-10-08 MED ORDER — LACTATED RINGERS IV SOLN: 500.0000 mL | INTRAVENOUS | Status: DC | PRN

## 2017-10-08 MED ORDER — ZOLPIDEM TARTRATE 5 MG PO TABS: 5.0000 mg | ORAL_TABLET | Freq: Every evening | ORAL | Status: DC | PRN

## 2017-10-08 MED ORDER — PRENATAL MULTIVITAMIN CH
1.0000 | ORAL_TABLET | Freq: Every day | ORAL | Status: DC
  Administered 2017-10-09 – 2017-10-10 (×2): 1 via ORAL
  Filled 2017-10-08 (×2): qty 1

## 2017-10-08 MED ORDER — ACETAMINOPHEN 325 MG PO TABS
650.0000 mg | ORAL_TABLET | ORAL | Status: DC | PRN
  Administered 2017-10-08: 650 mg via ORAL
  Filled 2017-10-08: qty 2

## 2017-10-08 MED ORDER — OXYCODONE-ACETAMINOPHEN 5-325 MG PO TABS
2.0000 | ORAL_TABLET | ORAL | Status: DC | PRN
  Administered 2017-10-09 (×4): 2 via ORAL
  Filled 2017-10-08 (×4): qty 2

## 2017-10-08 MED ORDER — ONDANSETRON HCL 4 MG/2ML IJ SOLN: 4.0000 mg | Freq: Four times a day (QID) | INTRAMUSCULAR | Status: DC | PRN

## 2017-10-08 MED ORDER — PHENYLEPHRINE 40 MCG/ML (10ML) SYRINGE FOR IV PUSH (FOR BLOOD PRESSURE SUPPORT)
PREFILLED_SYRINGE | INTRAVENOUS | Status: AC
  Filled 2017-10-08: qty 10

## 2017-10-08 MED ORDER — DIPHENHYDRAMINE HCL 25 MG PO CAPS: 25.0000 mg | ORAL_CAPSULE | Freq: Four times a day (QID) | ORAL | Status: DC | PRN

## 2017-10-08 MED ORDER — FENTANYL 2.5 MCG/ML BUPIVACAINE 1/10 % EPIDURAL INFUSION (WH - ANES)
14.0000 mL/h | INTRAMUSCULAR | Status: DC | PRN
  Administered 2017-10-08 (×2): 14 mL/h via EPIDURAL
  Filled 2017-10-08: qty 100

## 2017-10-08 MED ORDER — ONDANSETRON HCL 4 MG PO TABS: 4.0000 mg | ORAL_TABLET | ORAL | Status: DC | PRN

## 2017-10-08 MED ORDER — MEDROXYPROGESTERONE ACETATE 150 MG/ML IM SUSP: 150.0000 mg | INTRAMUSCULAR | Status: DC | PRN

## 2017-10-08 MED ORDER — LIDOCAINE HCL (PF) 1 % IJ SOLN
INTRAMUSCULAR | Status: DC | PRN
  Administered 2017-10-08 (×2): 4 mL via EPIDURAL

## 2017-10-08 MED ORDER — MEASLES, MUMPS & RUBELLA VAC ~~LOC~~ INJ
0.5000 mL | INJECTION | Freq: Once | SUBCUTANEOUS | Status: DC
  Filled 2017-10-08: qty 0.5

## 2017-10-08 MED ORDER — TERBUTALINE SULFATE 1 MG/ML IJ SOLN
0.2500 mg | Freq: Once | INTRAMUSCULAR | Status: DC | PRN
  Filled 2017-10-08: qty 1

## 2017-10-08 MED ORDER — BENZOCAINE-MENTHOL 20-0.5 % EX AERO
1.0000 "application " | INHALATION_SPRAY | CUTANEOUS | Status: DC | PRN
  Administered 2017-10-08: 1 via TOPICAL
  Filled 2017-10-08: qty 56

## 2017-10-08 MED ORDER — SENNOSIDES-DOCUSATE SODIUM 8.6-50 MG PO TABS
2.0000 | ORAL_TABLET | ORAL | Status: DC
  Administered 2017-10-08 – 2017-10-09 (×2): 2 via ORAL
  Filled 2017-10-08 (×2): qty 2

## 2017-10-08 MED ORDER — OXYCODONE-ACETAMINOPHEN 5-325 MG PO TABS: 1.0000 | ORAL_TABLET | ORAL | Status: DC | PRN

## 2017-10-08 MED ORDER — OXYTOCIN 40 UNITS IN LACTATED RINGERS INFUSION - SIMPLE MED
2.5000 [IU]/h | INTRAVENOUS | Status: DC
  Filled 2017-10-08: qty 1000

## 2017-10-08 MED ORDER — COCONUT OIL OIL
1.0000 "application " | TOPICAL_OIL | Status: DC | PRN
  Administered 2017-10-08: 1 via TOPICAL
  Filled 2017-10-08: qty 120

## 2017-10-08 NOTE — Anesthesia Postprocedure Evaluation (Signed)
Anesthesia Post Note  Patient: Karen Booker  Procedure(s) Performed: AN AD HOC LABOR EPIDURAL     Patient location during evaluation: Mother Baby Anesthesia Type: Epidural Level of consciousness: awake Pain management: pain level controlled Vital Signs Assessment: post-procedure vital signs reviewed and stable Respiratory status: spontaneous breathing Cardiovascular status: stable Postop Assessment: no headache, no backache, epidural receding, patient able to bend at knees, no apparent nausea or vomiting and adequate PO intake Anesthetic complications: no    Last Vitals:  Vitals:   10/08/17 1445 10/08/17 1830  BP: 108/73 112/73  Pulse: 92 86  Resp: 16 18  Temp: 37.2 C 36.8 C  SpO2: 98% 97%    Last Pain:  Vitals:   10/08/17 1830  TempSrc: Oral  PainSc: 2    Pain Goal: Patients Stated Pain Goal: 3 (10/08/17 1700)               Sinahi Knights

## 2017-10-08 NOTE — Anesthesia Preprocedure Evaluation (Signed)
Anesthesia Evaluation  Patient identified by MRN, date of birth, ID band Patient awake    Reviewed: Allergy & Precautions, Patient's Chart, lab work & pertinent test results  Airway Mallampati: II  TM Distance: >3 FB Neck ROM: Full    Dental   Pulmonary neg pulmonary ROS,    Pulmonary exam normal breath sounds clear to auscultation       Cardiovascular negative cardio ROS Normal cardiovascular exam Rhythm:Regular Rate:Normal     Neuro/Psych PSYCHIATRIC DISORDERS Anxiety Depression    GI/Hepatic negative GI ROS, Neg liver ROS,   Endo/Other  negative endocrine ROS  Renal/GU negative Renal ROS     Musculoskeletal negative musculoskeletal ROS (+)   Abdominal   Peds  Hematology negative hematology ROS (+)   Anesthesia Other Findings   Reproductive/Obstetrics (+) Pregnancy                             Anesthesia Physical Anesthesia Plan  ASA: II  Anesthesia Plan: Epidural   Post-op Pain Management:    Induction:   PONV Risk Score and Plan:   Airway Management Planned:   Additional Equipment:   Intra-op Plan:   Post-operative Plan:   Informed Consent: I have reviewed the patients History and Physical, chart, labs and discussed the procedure including the risks, benefits and alternatives for the proposed anesthesia with the patient or authorized representative who has indicated his/her understanding and acceptance.     Plan Discussed with:   Anesthesia Plan Comments:         Anesthesia Quick Evaluation

## 2017-10-08 NOTE — Lactation Note (Signed)
This note was copied from a baby's chart. Lactation Consultation Note  Patient Name: Karen Booker OACZY'S Date: 10/08/2017 Reason for consult: Initial assessment;Primapara;1st time breastfeeding;Term;Nipple pain/trauma  P1 mother whose infant is now 38 hours old.    Mother was attempting to breastfeed as I arrived.  Offered to assist with latch and mother accepted.  Mother's breasts are soft and non tender with everted nipples bilaterally.  Left nipple has a couple of blisters from improper latch and the right nipple is slightly reddened and a fine line evident on upper side.  Mother states she feels pain with latching.  Discussed the importance of waiting for a wide open mouth before attempting to latch.  Mother recognized that she was not previously doing this.  Assisted her to hold baby in the cross cradle position and latched onto the left breast without difficulty.  Lips were flanged and mother observed how I directed the baby to latch firmly into the breast tissue.  Mother stated she still was sensitive but it was much better and felt the latch was good.  She did not feel pain.  Taught her how to perform breast compressions and how to keep baby awake at the breast.  Baby sucked actively for 15 minutes before self releasing.  Instructed parents on burping and baby burped before going back to the right breast in the same hold.  Mother performed the latch much better this time.  Baby was not as active but still was sucking as I left the room.    Encouraged to feed 8-12 times/24 hours or sooner if baby shows feeding cues. Reviewed feeding cues.  Continue a lot of STS, breast massage and hand expression before/after feedings.  Colostrum container provided for any EBM mother may obtain with hand expression.  She will finger feed colostrum drops back to baby.    Mom made aware of O/P services, breastfeeding support groups, community resources, and our phone # for post-discharge questions. Both  parents are very receptive and interested in learning.  Family and friends present and supportive.  Mother will call for latch assistance as needed.  Comfort gels at bedside and I initiated these and showed father how to cool them.  Mother also had breast shells at bedside to use as needed for nipple pain and soreness.      Maternal Data Formula Feeding for Exclusion: No Has patient been taught Hand Expression?: Yes Does the patient have breastfeeding experience prior to this delivery?: No  Feeding Feeding Type: Breast Fed Length of feed: 15 min(still feeding when I left the room)  LATCH Score Latch: Grasps breast easily, tongue down, lips flanged, rhythmical sucking.  Audible Swallowing: A few with stimulation  Type of Nipple: Everted at rest and after stimulation  Comfort (Breast/Nipple): Filling, red/small blisters or bruises, mild/mod discomfort  Hold (Positioning): Assistance needed to correctly position infant at breast and maintain latch.  LATCH Score: 7  Interventions Interventions: Breast feeding basics reviewed;Assisted with latch;Skin to skin;Breast massage;Hand express;Position options;Support pillows;Adjust position;Breast compression;Shells;Comfort gels  Lactation Tools Discussed/Used     Consult Status Consult Status: Follow-up Date: 10/09/17 Follow-up type: In-patient    Little Ishikawa 10/08/2017, 6:19 PM

## 2017-10-08 NOTE — Anesthesia Pain Management Evaluation Note (Signed)
  CRNA Pain Management Visit Note  Patient: Karen Booker, 27 y.o., female  "Hello I am a member of the anesthesia team at [facility]. We have an anesthesia team available at all times to provide care throughout the hospital, including epidural management and anesthesia for C-section. I don't know your plan for the delivery whether it a natural birth, water birth, IV sedation, nitrous supplementation, doula or epidural, but we want to meet your pain goals."   1.Was your pain managed to your expectations on prior hospitalizations?   No prior hospitalizations  2.What is your expectation for pain management during this hospitalization?     Epidural  3.How can we help you reach that goal? Epidural intact and working well;pt placed on left side to increase denseness of block but overall comfortable.  Record the patient's initial score and the patient's pain goal.   Pain: 3  Pain Goal: 1 [facility] wants you to be able to say your pain was always managed very well.  Everette Rank 10/08/2017

## 2017-10-08 NOTE — Progress Notes (Signed)
When getting patient up to empty bladder on L&D, pt had syncopal episode. Pt was on commode and stedy was infront of her. Ammonia was used and pt was taken back to the bed. Bleeding WNL. Fluid bolus of LR given. VSS. Pt ready to transfer to [facility].

## 2017-10-08 NOTE — Anesthesia Procedure Notes (Signed)
Epidural Patient location during procedure: OB Start time: 10/08/2017 2:24 AM End time: 10/08/2017 2:34 AM  Staffing Anesthesiologist: Nolon Nations, MD Performed: anesthesiologist   Preanesthetic Checklist Completed: patient identified, pre-op evaluation, timeout performed, IV checked, risks and benefits discussed and monitors and equipment checked  Epidural Patient position: sitting Prep: site prepped and draped and DuraPrep Patient monitoring: heart rate, continuous pulse ox and blood pressure Approach: midline Location: L2-L3 Injection technique: LOR air and LOR saline  Needle:  Needle type: Tuohy  Needle gauge: 17 G Needle length: 9 cm Needle insertion depth: 6 cm Catheter type: closed end flexible Catheter size: 19 Gauge Catheter at skin depth: 11 cm Test dose: negative  Assessment Sensory level: T8 Events: blood not aspirated, injection not painful, no injection resistance, negative IV test and no paresthesia  Additional Notes Reason for block:procedure for pain

## 2017-10-08 NOTE — H&P (Signed)
Karen Booker is a 27 y.o. female presenting for SOL. OB History    Gravida  2   Para      Term      Preterm      AB  1   Living        SAB  1   TAB      Ectopic      Multiple      Live Births             Past Medical History:  Diagnosis Date  . Anxiety   . Depression   . IBS (irritable bowel syndrome)   . Irritable bowel syndrome   . Neuromuscular disorder ([place])   . Other preterm infants, unspecified (weight)(765.10)   . Scoliosis (and kyphoscoliosis), idiopathic   . Urinary tract infection, site not specified   . UTI (urinary tract infection)    Past Surgical History:  Procedure Laterality Date  . WISDOM TOOTH EXTRACTION  2010   Family History: family history includes Alcohol abuse in her father; Arthritis in her maternal grandmother; Cancer in her paternal grandfather; Diabetes in her maternal grandmother; Hyperlipidemia in her mother; Mental illness in her sister. Social History:  reports that she has never smoked. She has never used smokeless tobacco. She reports that she drinks alcohol. She reports that she does not use drugs.     Maternal Diabetes: No Genetic Screening: Normal Maternal Ultrasounds/Referrals: Normal Fetal Ultrasounds or other Referrals:  None Maternal Substance Abuse:  No Significant Maternal Medications:  None Significant Maternal Lab Results:  None Other Comments:  None  ROS History Dilation: 8 Effacement (%): 100 Station: Plus 1 Exam by:: Dr. Royston Sinner Blood pressure (!) 102/56, pulse 76, temperature 98.2 F (36.8 C), temperature source Oral, resp. rate 16, height 5\' 5"  (1.651 m), weight 74.4 kg, unknown if currently breastfeeding. Exam Physical Exam  Prenatal labs: ABO, Rh: --/--/O POS (08/27 0115) Antibody: NEG (08/27 0115) Rubella: Immune (01/17 0000) RPR: Nonreactive (01/17 0000)  HBsAg: Negative (01/17 0000)  HIV: Non-reactive (01/17 0000)  GBS: Negative (08/05 0000)   Assessment/Plan: 27yo G2P0010 @ 39.4 wga  presenting for SOL. AROM for clear fluid - 8/c/+1. Expecting baby girl Karen Booker. GBS neg.    Karen Booker Karen Booker 10/08/2017, 6:11 AM

## 2017-10-09 ENCOUNTER — Other Ambulatory Visit: Payer: Self-pay

## 2017-10-09 LAB — CBC
HCT: 30.6 % — ABNORMAL LOW (ref 36.0–46.0)
Hemoglobin: 10.5 g/dL — ABNORMAL LOW (ref 12.0–15.0)
MCH: 34.3 pg — ABNORMAL HIGH (ref 26.0–34.0)
MCHC: 34.3 g/dL (ref 30.0–36.0)
MCV: 100 fL (ref 78.0–100.0)
Platelets: 221 10*3/uL (ref 150–400)
RBC: 3.06 MIL/uL — ABNORMAL LOW (ref 3.87–5.11)
RDW: 12.8 % (ref 11.5–15.5)
WBC: 16.5 10*3/uL — ABNORMAL HIGH (ref 4.0–10.5)

## 2017-10-09 NOTE — Progress Notes (Signed)
Patient doing well. No complaints. BP 101/71 (BP Location: Left Arm)   Pulse 75   Temp 97.7 F (36.5 C) (Oral)   Resp 16   Ht 5\' 5"  (1.651 m)   Wt 74.4 kg   SpO2 97%   Breastfeeding? Unknown   BMI 27.30 kg/m  Results for orders placed or performed during the hospital encounter of 10/07/17 (from the past 24 hour(s))  CBC     Status: Abnormal   Collection Time: 10/09/17  5:28 AM  Result Value Ref Range   WBC 16.5 (H) 4.0 - 10.5 K/uL   RBC 3.06 (L) 3.87 - 5.11 MIL/uL   Hemoglobin 10.5 (L) 12.0 - 15.0 g/dL   HCT 30.6 (L) 36.0 - 46.0 %   MCV 100.0 78.0 - 100.0 fL   MCH 34.3 (H) 26.0 - 34.0 pg   MCHC 34.3 30.0 - 36.0 g/dL   RDW 12.8 11.5 - 15.5 %   Platelets 221 150 - 400 K/uL   Abdomen is soft and non tender  Lochia WNL  Impression: PPD # 1   PLAN: Routine care Sitz bath prn Discharge home tomorrow

## 2017-10-09 NOTE — Progress Notes (Signed)
CSW attempted to meet with MOB to offer support and complete assessment due to hx of Anxiety/Depression and [place] Postnatal Depression Scale score of 10, but she stated she was breastfeeding at this time and asked that CSW return at a later time.  CSW will attempt to return at a later time.

## 2017-10-09 NOTE — Lactation Note (Signed)
This note was copied from a baby's chart. Lactation Consultation Note  Patient Name: Karen Booker FOYDX'A Date: 10/09/2017 Reason for consult: Follow-up assessment;Difficult latch;Nipple pain/trauma;Term;1st time breastfeeding;Primapara  P1 mother whose infant is now 61 hours old.  Mother requested latch assistance.  I assisted this mother yesterday and I see the same issue continuing today.  Baby has not been latching deeply enough onto mother's breasts.  Today, both nipples are reddened, irritated and have small blisters on them with small positional stripes.    Reviewed basic teaching regarding latching and mother admitted to not being able to get her to latch deeply.  However, she did not call for assistance throughout the evening and night.  Mother enjoys the cross cradle position but I suggested the football hold this time and she willingly accepted.  Assisted baby to latch onto the left breast easily.  She had a wide open mouth, flanged lips and audible swallows noted immediately.  Mother performed breast compressions with feeding and baby continued to suck actively for 15 minutes before self releasing.  Mother's nipple rounded and she felt much better with this latch.  She did have some sensitivity, but, this would be expected seeing what her nipples looked like.  Baby burped and then mother demonstrated latching onto the right breast in the football ;hold and needed minimal assistance.  I praised her efforts and encouraged her to be more assertive to obtain a deep latch.  She feels like she is going to "hurt" the baby.  I reassured her that her technique will take practice but that she is learning just like baby is learning.  Parents were very excited to hear baby swallowing.    To help mother's nipples heal she will continue using EBM and comfort gels any time baby is not feeding.  I offered to initiate the DEBP to let the nipples "rest" periodically and mother very receptive to this idea.   Pump parts, assembly, disassembly and cleaning of the parts reviewed with parents.  Mother will hand express and use any EBM she obtains with hand expression and pumping to supplement baby.  Colostrum container provided with instructions for milk storage times.  Mother will call throughout the day/night for latch assistance as needed.  I encouraged her to take advantage of RN/LC support while she is in the hospital.  She has a DEBP for home use.  Father very interested in helping and asks appropriate questions.     Maternal Data Formula Feeding for Exclusion: No Has patient been taught Hand Expression?: Yes Does the patient have breastfeeding experience prior to this delivery?: No  Feeding Feeding Type: Breast Fed Length of feed: 5 min  LATCH Score Latch: Grasps breast easily, tongue down, lips flanged, rhythmical sucking.  Audible Swallowing: Spontaneous and intermittent  Type of Nipple: Everted at rest and after stimulation  Comfort (Breast/Nipple): Filling, red/small blisters or bruises, mild/mod discomfort  Hold (Positioning): Assistance needed to correctly position infant at breast and maintain latch.  LATCH Score: 8  Interventions Interventions: Breast feeding basics reviewed;Assisted with latch;Skin to skin;Breast massage;Hand express;Position options;Support pillows;Adjust position;Breast compression;Shells;Comfort gels;DEBP  Lactation Tools Discussed/Used WIC Program: No Pump Review: Setup, frequency, and cleaning;Milk Storage Initiated by:: Japneet Staggs Date initiated:: 10/10/17   Consult Status Consult Status: Follow-up Date: 10/10/17 Follow-up type: In-patient    Tanasia Budzinski R Asad Keeven 10/09/2017, 1:40 PM

## 2017-10-10 MED ORDER — IBUPROFEN 600 MG PO TABS: 600.0000 mg | ORAL_TABLET | Freq: Four times a day (QID) | ORAL | 0 refills | Status: DC

## 2017-10-10 MED ORDER — SENNOSIDES-DOCUSATE SODIUM 8.6-50 MG PO TABS: 2.0000 | ORAL_TABLET | ORAL | 0 refills | Status: AC

## 2017-10-10 MED ORDER — OXYCODONE-ACETAMINOPHEN 5-325 MG PO TABS: 1.0000 | ORAL_TABLET | ORAL | 0 refills | Status: DC | PRN

## 2017-10-10 NOTE — Lactation Note (Signed)
This note was copied from a baby's chart. Lactation Consultation Note  Patient Name: Karen Booker YIFOY'D Date: 10/10/2017 Reason for consult: Follow-up assessment;Mother's request;1st time breastfeeding P1, 6 hours female infant. LC enter room mom was breastfeeding infant on left breast using football hold.  LC lowered bottom jaw for deeper latch and demonstrated to dad how to assist mom if latch is shallow. Infant mouth was wide and lower jaw extended downward, per mom, this is the  best latch she has had  since she started BF infant. Latch score 8 , infant feed 20 minutes. LC reinforced infant latching with wide mouth gape like "Biting into an apple".  Taught mom how to break latch w/ finger to reduce nipple trauma. Per mom, she is feeling more confident w/ breastfeeding. LC assisted mom in hand expression due breast soreness instead of her using DEBP. Mom hand expressed 6 ml of colostrum which dad feed to infant w/ curve tip syringe. LC reinforced feeding according hunger cues, 8 to 12 times within 24 hours. LC discussed engorgement treatment and prevention.     Maternal Data    Feeding    LATCH Score Latch: Grasps breast easily, tongue down, lips flanged, rhythmical sucking.  Audible Swallowing: Spontaneous and intermittent  Type of Nipple: Everted at rest and after stimulation  Comfort (Breast/Nipple): Filling, red/small blisters or bruises, mild/mod discomfort  Hold (Positioning): Assistance needed to correctly position infant at breast and maintain latch.  LATCH Score: 8  Interventions Interventions: Breast feeding basics reviewed;Support pillows;Position options;Assisted with latch;Hand express  Lactation Tools Discussed/Used     Consult Status Consult Status: Follow-up Date: 10/10/17 Follow-up type: In-patient    Vicente Serene 10/10/2017, 5:42 AM

## 2017-10-10 NOTE — Progress Notes (Signed)
CSW received consult for hx of Anxiety and Depression.  CSW met with MOB to offer support and complete assessment.   MOB was pumping at this time, but stated that CSW could come in.  FOB was holding baby who stayed asleep throughout the entire assessment.  CSW noted that MOB was only pumping one breast and asked her if there was a reason for this.  MOB replied, "it's just the one I started with."  CSW provided brief education regarding pumping, to which she was greatly appreciative.   MOB explained that she has been crying a lot this morning and asked CSW to "take it for what it's worth."  CSW explained that this is completely normal and does not mean that she has or will have PPD.   CSW provided education regarding the baby blues period vs. perinatal mood disorders, discussed treatment and gave resources for mental health follow up if concerns arise.  CSW recommends self-evaluation during the postpartum time period using the New Mom Checklist from Postpartum Progress, as well as the [place] Postnatal Depression Scale and encouraged parents to contact a medical professional if symptoms are noted at any time.  MOB states she will talk with her PCP, Dr. Birdie Riddle if she has concerns and states that she has spoken with her in the past when she was experiencing some situational anxiety and depression in the past.  She reports no current symptoms.  FOB appears very involved and supportive. Parents were extremely attentive to information shared with them by CSW.  They asked great questions, including how to contact CSW if they would like to talk more about this after discharge and how to handle the numerous amount of friends and family who want to visit once they get home.  CSW appreciated these questions and discussed ways to handle this.  Parents were appreciative.  Parents, especially FOB, thanked CSW for talking with them today and stated that it was very helpful. CSW identifies no further need for intervention  and no barriers to discharge at this time.

## 2017-10-10 NOTE — Discharge Summary (Signed)
Obstetric Discharge Summary Reason for Admission: onset of labor Prenatal Procedures: none Intrapartum Procedures: spontaneous vaginal delivery Postpartum Procedures: none Complications-Operative and Postpartum: 2nd degree perineal laceration Hemoglobin  Date Value Ref Range Status  10/09/2017 10.5 (L) 12.0 - 15.0 g/dL Final    Comment:    DELTA CHECK NOTED REPEATED TO VERIFY    HCT  Date Value Ref Range Status  10/09/2017 30.6 (L) 36.0 - 46.0 % Final    Physical Exam:  General: alert, cooperative and appears stated age 27: appropriate Uterine Fundus: firm Incision: healing well, no significant drainage, no dehiscence DVT Evaluation: No evidence of DVT seen on physical exam. Negative Homan's sign. No cords or calf tenderness.  Discharge Diagnoses: Term Pregnancy-delivered  Discharge Information: Date: 10/10/2017 Activity: pelvic rest Diet: routine Medications: PNV, Ibuprofen and Percocet Condition: stable Instructions: refer to practice specific booklet Discharge to: home   Newborn Data: Live born female  Birth Weight: 8 lb 3 oz (3714 g) APGAR: 42, 9  Newborn Delivery   Birth date/time:  10/08/2017 11:11:00 Delivery type:  Vaginal, Spontaneous     Home with mother.  Ryelle Ruvalcaba, [place] 10/10/2017, 8:33 AM

## 2017-10-10 NOTE — Plan of Care (Signed)
  Problem: Coping: Goal: Level of anxiety will decrease Outcome: Progressing  Patient with increased anxiety through shift. Able to work through situations with reassurance.  Problem: Skin Integrity: Goal: Risk for impaired skin integrity will decrease Outcome: Progressing  Patient has sore, cracked, blistered nipples at both breasts. Using comfort interventions and lactation support to help healing process.

## 2017-10-10 NOTE — Lactation Note (Signed)
This note was copied from a baby's chart. Lactation Consultation Note; Mom with very sore nipples, both nipples very pink. Using comfort gels and reports they are helping. Mom does not want to put baby to the breast because they are so sore. Mom has just pumped 20 ml whitish milk. Finger fed with curved tip syringe. Discussed finger feeding with # 5 fr feeding tube/ syringe. parents agreeable to try it. Used it for the last 7 ml and parents report this feels very easy to use. Encouraged to continuing pumping q 2-3 hours for 15- 20 min. Has Spectra pump for home. Mom tearful, concerned about going home. Encouragement given that she is providing breast milk for her baby. Baby off to sleep. Encouraged to rest whenever baby is sleeping. Reports they will have help at home. Reviewed our phone number, OP appointments and BFSG as resources for support after DC. No questions at present. To call prn or if wants OP appointment.   Patient Name: Karen Booker TOIZT'I Date: 10/10/2017 Reason for consult: Follow-up assessment   Maternal Data Formula Feeding for Exclusion: No Has patient been taught Hand Expression?: Yes Does the patient have breastfeeding experience prior to this delivery?: No  Feeding    LATCH Score                   Interventions Interventions: Breast feeding basics reviewed;Expressed milk;Comfort gels;Hand express  Lactation Tools Discussed/Used Pump Review: Setup, frequency, and cleaning   Consult Status Consult Status: Complete    Truddie Crumble 10/10/2017, 10:22 AM

## 2017-10-10 NOTE — Discharge Instructions (Signed)
Call MD for T>100.4, heavy vaginal bleeding, severe abdominal pain, or respiratory distress.  Call office to schedule postpartum visit in 6 weeks.  No driving while taking narcotics.  Pelvic rest x 6 weeks.

## 2017-10-14 ENCOUNTER — Telehealth (HOSPITAL_COMMUNITY): Payer: Self-pay | Admitting: Lactation Services

## 2017-10-14 NOTE — Telephone Encounter (Signed)
Lactation Telephone call: [place] answered incoming call,  Dad Remo Lipps was calling for mom and [place] could hear mom in the back ground adding to the  [place]. ( per dad Mom is [place] Free )  Per dad calling to ask for extra curved tip syringes.  [place] asked for more information - date of birth 39/79 ( 55 days old )  Birth weight 8-3 oz , D/C was Thursday weight 7-10 oz , baby is having greater 5-6 wet in 24 hours, > 5-6 yellow stools.  Last weight at the [facility] office - Sat 8/31 - was 7-11 oz.next weight this Thursday.  Finger feeding  The day  of D/C with a curved tip syringe due to sore nipples, and mom has been pumping, milk is in and the soreness is improving, last to the breast was this am and it was pinching alittle. Per dad per mom denies engorgement.  LC recommended if the breast are to full to express off enough so the nipple / areola complex is compressible like a sandwich, and then explained reverse pressure exercise , may need to repeat it so the areola is compressible enough for the baby's mouth.  Instead of using the curved tip syringe, switch to a Dr. Owens Shark nipple and PACE feed.  Mother informed of post-discharge support and given phone number to the lactation department, including services for phone call assistance; out-patient appointments; and breastfeeding support group. List of other breastfeeding resources in the community given in the handout. Encouraged mother to call for problems or concerns related to breastfeeding.

## 2017-10-17 ENCOUNTER — Inpatient Hospital Stay (HOSPITAL_COMMUNITY): Admission: RE | Admit: 2017-10-17 | Payer: BC Managed Care – PPO | Source: Ambulatory Visit

## 2017-12-30 ENCOUNTER — Telehealth: Payer: Self-pay | Admitting: Hematology

## 2017-12-30 ENCOUNTER — Encounter: Payer: Self-pay | Admitting: Hematology

## 2017-12-30 NOTE — Telephone Encounter (Signed)
New referral received from Dr. Royston Sinner for easy bruising, elevated platelets and eosinophils. Cld and scheduled the pt to see Dr. Irene Limbo on 12/9 at 1pm. Pt aware to arrive 30 minutes early. Letter mailed.

## 2018-01-17 ENCOUNTER — Ambulatory Visit (INDEPENDENT_AMBULATORY_CARE_PROVIDER_SITE_OTHER): Payer: BC Managed Care – PPO | Admitting: Family Medicine

## 2018-01-17 ENCOUNTER — Encounter: Payer: Self-pay | Admitting: Family Medicine

## 2018-01-17 ENCOUNTER — Other Ambulatory Visit: Payer: Self-pay

## 2018-01-17 VITALS — BP 110/72 | HR 76 | Temp 98.0°F | Resp 16 | Ht 65.0 in | Wt 126.0 lb

## 2018-01-17 DIAGNOSIS — Z0000 Encounter for general adult medical examination without abnormal findings: Secondary | ICD-10-CM | POA: Diagnosis not present

## 2018-01-17 NOTE — Patient Instructions (Signed)
Follow up in 1 year or as needed Keep up the good work on healthy diet and regular exercise- you look fantastic! Call with any questions or concerns Happy Holidays!!! CONGRATS!!!

## 2018-01-17 NOTE — Assessment & Plan Note (Signed)
Pt's PE WNL.  UTD on flu and Tdap.  No need for labs.  Anticipatory guidance provided.

## 2018-01-17 NOTE — Progress Notes (Signed)
   Subjective:    Patient ID: Karen Booker, female    DOB: 09-23-1990, 27 y.o.   MRN: [id number]  HPI CPE- UTD on Tdap, flu, pap.  No concerns.   Review of Systems Patient reports no vision/ hearing changes, adenopathy,fever, weight change,  persistant/recurrent hoarseness , swallowing issues, chest pain, palpitations, edema, persistant/recurrent cough, hemoptysis, dyspnea (rest/exertional/paroxysmal nocturnal), gastrointestinal bleeding (melena, rectal bleeding), abdominal pain, significant heartburn, bowel changes, GU symptoms (dysuria, hematuria, incontinence), Gyn symptoms (abnormal  bleeding, pain),  syncope, focal weakness, memory loss, numbness & tingling, skin/hair/nail changes, abnormal bruising or bleeding, anxiety, or depression.     Objective:   Physical Exam General Appearance:    Alert, cooperative, no distress, appears stated age  Head:    Normocephalic, without obvious abnormality, atraumatic  Eyes:    PERRL, conjunctiva/corneas clear, EOM's intact, fundi    benign, both eyes  Ears:    Normal TM's and external ear canals, both ears  Nose:   Nares normal, septum midline, mucosa normal, no drainage    or sinus tenderness  Throat:   Lips, mucosa, and tongue normal; teeth and gums normal  Neck:   Supple, symmetrical, trachea midline, no adenopathy;    Thyroid: no enlargement/tenderness/nodules  Back:     Symmetric, no curvature, ROM normal, no CVA tenderness  Lungs:     Clear to auscultation bilaterally, respirations unlabored  Chest Wall:    No tenderness or deformity   Heart:    Regular rate and rhythm, S1 and S2 normal, no murmur, rub   or gallop  Breast Exam:    Deferred to GYN  Abdomen:     Soft, non-tender, bowel sounds active all four quadrants,    no masses, no organomegaly  Genitalia:    Deferred to GYN  Rectal:    Extremities:   Extremities normal, atraumatic, no cyanosis or edema  Pulses:   2+ and symmetric all extremities  Skin:   Skin color, texture, turgor  normal, no rashes or lesions  Lymph nodes:   Cervical, supraclavicular, and axillary nodes normal  Neurologic:   CNII-XII intact, normal strength, sensation and reflexes    throughout          Assessment & Plan:

## 2018-01-20 ENCOUNTER — Encounter: Payer: Self-pay | Admitting: Hematology

## 2018-01-20 ENCOUNTER — Inpatient Hospital Stay: Payer: BC Managed Care – PPO | Attending: Hematology | Admitting: Hematology

## 2018-01-20 VITALS — BP 114/78 | HR 108 | Temp 98.5°F | Resp 18 | Ht 65.0 in | Wt 125.3 lb

## 2018-01-20 DIAGNOSIS — D473 Essential (hemorrhagic) thrombocythemia: Secondary | ICD-10-CM

## 2018-01-20 DIAGNOSIS — R238 Other skin changes: Secondary | ICD-10-CM | POA: Diagnosis not present

## 2018-01-20 DIAGNOSIS — D75839 Thrombocytosis, unspecified: Secondary | ICD-10-CM

## 2018-01-20 DIAGNOSIS — D721 Eosinophilia: Secondary | ICD-10-CM | POA: Insufficient documentation

## 2018-01-20 DIAGNOSIS — K648 Other hemorrhoids: Secondary | ICD-10-CM | POA: Insufficient documentation

## 2018-01-20 DIAGNOSIS — Z79899 Other long term (current) drug therapy: Secondary | ICD-10-CM

## 2018-01-20 DIAGNOSIS — R233 Spontaneous ecchymoses: Secondary | ICD-10-CM

## 2018-01-20 NOTE — Progress Notes (Signed)
HEMATOLOGY/ONCOLOGY CONSULTATION NOTE  Date of Service: 01/20/2018  Patient Care Team: Midge Minium, MD as PCP - General (Family Medicine) Aloha Gell, MD as Consulting Physician (Obstetrics and Gynecology)  CHIEF COMPLAINTS/PURPOSE OF CONSULTATION:  Thrombocytosis and Eosinophilia  Easy bruisability  HISTORY OF PRESENTING ILLNESS:   Karen Booker is a wonderful 27 y.o. female who has been referred to [place] by Dr. Annye Asa for evaluation and management of Thrombocytosis, Eosinophilia, and ease of bruising. The pt reports that she is doing well overall.   The pt had a successful spontaneous vaginal delivery on 10/08/17. Prior to this, the patient had a miscarriage one year prior and notes that the reason was not made clear. She denies any concerns for abnormal or excessive bleeding. She does note however that she also had bleeding hemorrhoids at the time of her delivery.   The pt notes that she has had two recent mastitis diagnoses.   The pt reports that in 2014 she was taking Prozaac, which led to significant abnormal bruising on her thighs. She took Prozaac for 4-5 months, but did not have blood work completed at that time.    The pt recently began Zoloft in early September for post-partum, and began experiencing abnormal bruising again. She notes that she then stopped Zoloft and her abnormal bruising has subsided. Her bruising was mostly on the sides of her thighs and noted that they felt hard in the middle. She notes that her bruises occurred only at places that she had itching.   The pt notes that she was using NSAIDs for the first 1-2 weeks after delivery, but transitioned to Tylenol after developing the aforementioned bruising.   The pt has been taking Fish Oil every day for 1-2 years, and is unsure of the dose.   The pt notes that she did have heavy periods prior to pregnancy but denies concerning anemia ever.   The pt had all four wisdom teeth extracted in  2011 and denies any concerns for abnormal bruising at that time.   Pt denies pets at home and denies animal exposure or exotic travel.   Most recent lab results (12/20/17) of CBC w/diff is as follows: all values are WNL except for MCHC at 10.9, PLT at 423k, Eosinophils abs at 776.  On review of systems, pt reports good energy levels, resolved abnormal bruising, and denies concerns for bleeding, excessive bleeding, abdominal pains, leg swelling, and any other symptoms.   On PMHx the pt reports wisdom teeth extraction in 2011, scoliosis. On Family Hx the pt denies bleeding problems or clotting problems.  MEDICAL HISTORY:  Past Medical History:  Diagnosis Date  . Anxiety   . Depression   . IBS (irritable bowel syndrome)   . Irritable bowel syndrome   . Neuromuscular disorder ([place])   . Other preterm infants, unspecified (weight)(765.10)   . Scoliosis (and kyphoscoliosis), idiopathic   . Urinary tract infection, site not specified   . UTI (urinary tract infection)     SURGICAL HISTORY: Past Surgical History:  Procedure Laterality Date  . WISDOM TOOTH EXTRACTION  2010    SOCIAL HISTORY: Social History   Socioeconomic History  . Marital status: Married    Spouse name: Not on file  . Number of children: Not on file  . Years of education: Not on file  . Highest education level: Not on file  Occupational History  . Occupation: Ship broker at [employer]  . Financial resource strain: Not on file  .  Food insecurity:    Worry: Not on file    Inability: Not on file  . Transportation needs:    Medical: Not on file    Non-medical: Not on file  Tobacco Use  . Smoking status: Never Smoker  . Smokeless tobacco: Never Used  Substance and Sexual Activity  . Alcohol use: Yes    Comment: Socially  . Drug use: No  . Sexual activity: Yes    Birth control/protection: Pill  Lifestyle  . Physical activity:    Days per week: Not on file    Minutes per session: Not on file   . Stress: Not on file  Relationships  . Social connections:    Talks on phone: Not on file    Gets together: Not on file    Attends religious service: Not on file    Active member of club or organization: Not on file    Attends meetings of clubs or organizations: Not on file    Relationship status: Not on file  . Intimate partner violence:    Fear of current or ex partner: Not on file    Emotionally abused: Not on file    Physically abused: Not on file    Forced sexual activity: Not on file  Other Topics Concern  . Not on file  Social History Narrative  . Not on file    FAMILY HISTORY: Family History  Problem Relation Age of Onset  . Mental illness Sister   . Hyperlipidemia Mother   . Alcohol abuse Father   . Arthritis Maternal Grandmother   . Diabetes Maternal Grandmother   . Cancer Paternal Grandfather     ALLERGIES:  is allergic to cephalexin; nitrofurantoin; and other.  MEDICATIONS:  Current Outpatient Medications  Medication Sig Dispense Refill  . acetaminophen (TYLENOL) 500 MG tablet Take 500 mg by mouth every 6 (six) hours as needed for mild pain or headache.    . Omega-3 Fatty Acids (FISH OIL) 1000 MG CAPS Take 1 capsule by mouth daily.    . Prenatal Vit-Fe Fumarate-FA (PRENATAL MULTIVITAMIN) TABS tablet Take 1 tablet by mouth daily at 12 noon.     No current facility-administered medications for this visit.     REVIEW OF SYSTEMS:    10 Point review of Systems was done is negative except as noted above.  PHYSICAL EXAMINATION:   . Vitals:   01/20/18 1306  BP: 114/78  Pulse: (!) 108  Resp: 18  Temp: 98.5 F (36.9 C)  SpO2: 99%   Filed Weights   01/20/18 1306  Weight: 125 lb 4.8 oz (56.8 kg)   .Body mass index is 20.85 kg/m.  GENERAL:alert, in no acute distress and comfortable SKIN: no acute rashes, no significant lesions EYES: conjunctiva are pink and non-injected, sclera anicteric OROPHARYNX: MMM, no exudates, no oropharyngeal erythema or  ulceration NECK: supple, no JVD LYMPH:  no palpable lymphadenopathy in the cervical, axillary or inguinal regions LUNGS: clear to auscultation b/l with normal respiratory effort HEART: regular rate & rhythm ABDOMEN:  normoactive bowel sounds , non tender, not distended. No palpable hepatosplenomegaly.  Extremity: no pedal edema PSYCH: alert & oriented x 3 with fluent speech NEURO: no focal motor/sensory deficits  LABORATORY DATA:  I have reviewed the data as listed  . CBC Latest Ref Rng & Units 10/09/2017 10/08/2017 11/07/2016  WBC 4.0 - 10.5 K/uL 16.5(H) 13.5(H) 6.1  Hemoglobin 12.0 - 15.0 g/dL 10.5(L) 13.7 13.3  Hematocrit 36.0 - 46.0 % 30.6(L) 39.5 38.1  Platelets 150 - 400 K/uL 221 244 334   . CBC    Component Value Date/Time   WBC 16.5 (H) 10/09/2017 0528   RBC 3.06 (L) 10/09/2017 0528   HGB 10.5 (L) 10/09/2017 0528   HCT 30.6 (L) 10/09/2017 0528   PLT 221 10/09/2017 0528   MCV 100.0 10/09/2017 0528   MCH 34.3 (H) 10/09/2017 0528   MCHC 34.3 10/09/2017 0528   RDW 12.8 10/09/2017 0528   LYMPHSABS 1.7 12/27/2010 0831   MONOABS 0.3 12/27/2010 0831   EOSABS 0.2 12/27/2010 0831   BASOSABS 0.0 12/27/2010 0831     . CMP Latest Ref Rng & Units 09/08/2015 07/09/2014 12/27/2010  Glucose 65 - 99 mg/dL 88 - 87  BUN 7 - 25 mg/dL 11 17 11   Creatinine 0.50 - 1.10 mg/dL 0.84 0.9 0.7  Sodium 135 - 146 mmol/L 141 - 140  Potassium 3.5 - 5.3 mmol/L 4.2 - 4.9  Chloride 98 - 110 mmol/L 103 - 105  CO2 20 - 31 mmol/L 23 - 26  Calcium 8.6 - 10.2 mg/dL 9.6 - 9.7  Total Protein 6.0 - 8.3 g/dL - - 7.3  Total Bilirubin 0.3 - 1.2 mg/dL - - 1.2  Alkaline Phos 25 - 125 U/L - 68 58  AST 13 - 35 U/L - 20 25  ALT 7 - 35 U/L - 17 18   12/20/17 CBC w/diff:    RADIOGRAPHIC STUDIES: I have personally reviewed the radiological images as listed and agreed with the findings in the report. No results found.  ASSESSMENT & PLAN:  27 y.o. with  1. Thrombocytosis - likely reactive 2. Eosinophilia  - likely reactive  3. Recent abnormal bruising- likely due to SSRIs, + use of NSAIDS and Fish Oil.  no fhx of bleeding issues and no issues with previous excessive surgical bleeding.  PLAN:  -Discussed patient's most recent labs from 12/30/17, PLT a little high at 423k and mild Eosinophilia at 776 in post-partum setting,  -Discussed that SSRIs and higher doses of Fish oil are known to cause abnormal bruising, and as the patient's bruising subsided after she stopped SSRIs, this was most likely the cause -Suspect that the mild thrombocytosis and Eosinophilia are both reactive from recent child birth and some element of iron deficiency -Pt's clotting system has been challenged with childbirth and wisdom teeth extractions, without concerns for abnormal or excessive bleeding -Recommended PO 150mg  Iron Polysaccharide through breast feeding -Offered to complete further work up with additional blood tests, which the patient declines at this time, which is not unreasonable  -Recommend checking PLT again with PCP in 3-6 months to ensure PLT are not elevating excessively  -Will be happy to see this pt back as needed    RTC with Dr Irene Limbo as needed   All of the patients questions were answered with apparent satisfaction. The patient knows to call the clinic with any problems, questions or concerns.  The total time spent in the appt was 35 minutes and more than 50% was on counseling and direct patient cares.    Karen Lone MD MS AAHIVMS [facility] Hematology/Oncology Physician [facility]  (Office):       [phone] (Work cell):  [phone] (Fax):           [phone]  01/20/2018 1:47 PM  I, Baldwin [place], am acting as a scribe for Dr. Sullivan Booker.   .I have reviewed the above documentation for accuracy and completeness, and I agree with the above. Suzan Slick  Juleen China MD

## 2018-01-20 NOTE — Patient Instructions (Signed)
Thank you for choosing [facility] to provide your oncology and hematology care.  To afford each patient quality time with our providers, please arrive 30 minutes before your scheduled appointment time.  If you arrive late for your appointment, you may be asked to reschedule.  We strive to give you quality time with our providers, and arriving late affects you and other patients whose appointments are after yours.    If you are a no show for multiple scheduled visits, you may be dismissed from the clinic at the providers discretion.     Again, thank you for choosing [facility], our hope is that these requests will decrease the amount of time that you wait before being seen by our physicians.  ______________________________________________________________________   Should you have questions after your visit to the [facility], please contact our office at [phone] between the hours of 8:30 and 4:30 p.m.    Voicemails left after 4:30p.m will not be returned until the following business day.     For prescription refill requests, please have your pharmacy contact us directly.  Please also try to allow 48 hours for prescription requests.     Please contact the scheduling department for questions regarding scheduling.  For scheduling of procedures such as PET scans, CT scans, MRI, Ultrasound, etc please contact central scheduling at [phone].     Resources For Cancer Patients and Caregivers:    Oncolink.org:  A wonderful resource for patients and healthcare providers for information regarding your disease, ways to tract your treatment, what to expect, etc.      American Cancer Society:  800-227-2345  Can help patients locate various types of support and financial assistance   Cancer Care: 1-800-813-HOPE (4673) Provides financial assistance, online support groups, medication/co-pay assistance.     [place] DSS:  336-641-3447 Where to apply  for food stamps, Medicaid, and utility assistance   Medicare Rights Center: 800-333-4114 Helps people with Medicare understand their rights and benefits, navigate the Medicare system, and secure the quality healthcare they deserve   SCAT: 336-333-6589 [place] [employer]'s shared-ride transportation service for eligible riders who have a disability that prevents them from riding the fixed route bus.     For additional information on assistance programs please contact our social worker:   Abigail Elmore:  [phone]  

## 2018-01-22 ENCOUNTER — Telehealth: Payer: Self-pay

## 2018-01-22 NOTE — Telephone Encounter (Signed)
RTC with Dr Irene Limbo as needed. Per 12/11 los

## 2018-02-21 ENCOUNTER — Telehealth: Payer: Self-pay | Admitting: Hematology

## 2018-02-21 NOTE — Telephone Encounter (Signed)
Faxed medical records to [facility], Release [id number]

## 2018-04-11 ENCOUNTER — Other Ambulatory Visit: Payer: Self-pay

## 2018-04-11 ENCOUNTER — Encounter: Payer: Self-pay | Admitting: Physical Therapy

## 2018-04-11 ENCOUNTER — Ambulatory Visit: Payer: BC Managed Care – PPO | Attending: Obstetrics and Gynecology | Admitting: Physical Therapy

## 2018-04-11 DIAGNOSIS — M533 Sacrococcygeal disorders, not elsewhere classified: Secondary | ICD-10-CM | POA: Diagnosis present

## 2018-04-11 DIAGNOSIS — R252 Cramp and spasm: Secondary | ICD-10-CM | POA: Insufficient documentation

## 2018-04-11 DIAGNOSIS — R278 Other lack of coordination: Secondary | ICD-10-CM | POA: Insufficient documentation

## 2018-04-11 DIAGNOSIS — M6281 Muscle weakness (generalized): Secondary | ICD-10-CM | POA: Diagnosis not present

## 2018-04-11 NOTE — Therapy (Signed)
[facility] [address] [place], [place], [address] Phone: [phone]   Fax:  [phone]  Physical Therapy Evaluation  Patient Details  Name: Karen Booker MRN: [id number] Date of Birth: 1990/02/20 Referring Provider (PT): Dr. Lucillie Garfinkel   Encounter Date: 04/11/2018  PT End of Session - 04/11/18 1026    Visit Number  1    Date for PT Re-Evaluation  07/04/18    Authorization Type  BCBS    PT Start Time  0800    PT Stop Time  0845    PT Time Calculation (min)  45 min    Activity Tolerance  Patient tolerated treatment well    Behavior During Therapy  [facility] for tasks assessed/performed       Past Medical History:  Diagnosis Date  . Anxiety   . Depression   . IBS (irritable bowel syndrome)   . Irritable bowel syndrome   . Neuromuscular disorder ([place])   . Other preterm infants, unspecified (weight)(765.10)   . Scoliosis (and kyphoscoliosis), idiopathic   . Urinary tract infection, site not specified   . UTI (urinary tract infection)     Past Surgical History:  Procedure Laterality Date  . WISDOM TOOTH EXTRACTION  2010    There were no vitals filed for this visit.   Subjective Assessment - 04/11/18 0804    Subjective  Patient had a large baby, 8 #. Patient had an episiotomy and it was large. Patient broke her tailbone and has to sit on a donut pillow in car. Patient has not had intercourse since she gave birth. Patient had pain with pelvic exam and had scar tissue. Her abdominal muscles are loose. I feel very in the pelvic area. No urinary leakage. Sometimes constipation. MD suggested cream .     Patient Stated Goals  reduce pelvic pain, improve vaginal health, work on abdominals, work on tailbone    Currently in Pain?  Yes    Pain Score  2    7/10 with exam; tailbone pain increase to 4/10   Pain Location  Coccyx    Pain Orientation  Mid    Pain Descriptors / Indicators  Dull;Aching    Pain Type  Acute  pain    Pain Onset  More than a month ago    Pain Frequency  Intermittent    Aggravating Factors   sitting, get up to release pressure from sitting, internal exam, sit on a memory foam mattress    Pain Relieving Factors  sit on a donut pillow    Multiple Pain Sites  No         [facility] PT Assessment - 04/11/18 0001      Assessment   Medical Diagnosis  R10.2 Pelvic and perineal pain    Referring Provider (PT)  Dr. Lucillie Garfinkel    Onset Date/Surgical Date  10/08/17    Prior Therapy  None      Precautions   Precautions  None      Restrictions   Weight Bearing Restrictions  No      Balance Screen   Has the patient fallen in the past 6 months  No    Has the patient had a decrease in activity level because of a fear of falling?   No    Is the patient reluctant to leave their home because of a fear of falling?   No      Home Film/video editor residence  Prior Function   Level of Independence  Independent    Vocation  Full time employment    Vocation Requirements  sitting    Leisure  walking, running      Cognition   Overall Cognitive Status  Within Functional Limits for tasks assessed      Posture/Postural Control   Posture/Postural Control  No significant limitations      ROM / Strength   AROM / PROM / Strength  AROM;PROM;Strength      Strength   Right Hip External Rotation   4/5    Right Hip Internal Rotation  4/5    Right Hip ABduction  4/5    Left Hip External Rotation  4/5    Left Hip Internal Rotation  4/5    Left Hip ABduction  4/5      Palpation   Palpation comment  tightness located in righ tlumbar; tenderness located in the diaghram; around coccyx area;       Special Tests   Other special tests  slight drop of hips in single leg stance                Objective measurements completed on examination: See above findings.    Pelvic Floor Special Questions - 04/11/18 0001    Diastasis Recti  1/2 fingers width below and  above umbilicus    Currently Sexually Active  No   scared of pain   Urinary Leakage  No    Skin Integrity  Erthema   dry   Scar  Episiotomy;Well healed;Restricted;Painful    External Palpation  tenderness located on the left ishiocavernosus, bulbocavernosus, perineal body, around the urethra    Pelvic Floor Internal Exam  Patient confirms identification and approves PT to assess the pelvic floor and treatment    Exam Type  Vaginal    Palpation  tenderness located on the left pelvic floor, perineal body    Strength  weak squeeze, no lift               PT Education - 04/11/18 1118    Education Details  pelvic floor massage, how to use the q-tip to the vulva and insert ; vaginal moisturizers    Person(s) Educated  Patient    Methods  Explanation;Demonstration;Verbal cues;Handout    Comprehension  Returned demonstration;Verbalized understanding       PT Short Term Goals - 04/11/18 1129      PT SHORT TERM GOAL #1   Title  independent with initial HEP with perineal massage    Time  4    Period  Weeks    Status  New    Target Date  05/09/18      PT SHORT TERM GOAL #2   Title  abilty to place the q-tip by the vagina with minimal to no pain due to improved tissue health    Time  4    Period  Weeks    Status  New    Target Date  05/09/18      PT SHORT TERM GOAL #3   Title  abilty to tolerate soft tissue work to the left pelvic floor muscles to reduce trigger points    Time  4    Period  Weeks    Status  New    Target Date  05/09/18      PT SHORT TERM GOAL #4   Title  therapist able to assess the coccyx bone  to place in correct alignment  Time  4    Period  Weeks    Status  New    Target Date  05/09/18      PT SHORT TERM GOAL #5   Title  understand ways to sit to reduce coccyx bone with a towel roll at work    Time  4    Period  Weeks    Status  New    Target Date  05/09/18        PT Long Term Goals - 04/11/18 1148      PT LONG TERM GOAL #1   Title   independent with HEP and understand how to progress herself    Time  12    Period  Weeks    Status  New    Target Date  07/04/18      PT LONG TERM GOAL #2   Title  ability to have penile penetration with minimal to no pain due to reduction of trigger points and improved tissue health    Time  12    Period  Weeks    Status  New    Target Date  07/04/18      PT LONG TERM GOAL #3   Title  sit for 45 minutes with minimal to no discomfort without a pillow due to coccyx in correct position and improved tissue mobility    Time  12    Period  Weeks    Status  New    Target Date  07/04/18      PT LONG TERM GOAL #4   Title  increased abdominal strength so she is able to perform core strength correctly    Time  12    Period  Weeks    Status  New    Target Date  07/04/18      PT LONG TERM GOAL #5   Title  ability to run 1 mile with no pain due to improve core strength and reduction in pelvic pain    Time  12    Period  Weeks    Status  New    Target Date  07/04/18             Plan - 04/11/18 1109    Clinical Impression Statement  Patient is a 28 year old female who gave birth to a large baby on 10/08/2017. Patient had an episiotomy and has scar tissue from it. Patient has tenderness located on the urethra, left pelvic floor muscles, perineal body, and ischiocavernosus and bulbocavernosus. Pelvic floor strength is 2/5 with no lift. Patient is able to bulge the pelvic floor and lift. Patient vulva and labia is dry and red. Patient has weakness in hips and abdominals. Diastasis recti is 1/2 finger width below and above the umbilicus. Patient will benefit from skilled therapy to reduce pelvic pain, return to exercise and improve vaginal health and able to sit without a donut.     History and Personal Factors relevant to plan of care:  gave birth on 10/08/2017; had episotomy with birth    Clinical Presentation  Evolving    Clinical Presentation due to:  affects sitting, pain with vaginal  exam, has not returned to running    Clinical Decision Making  Low    Rehab Potential  Excellent    Clinical Impairments Affecting Rehab Potential  none    PT Frequency  1x / week    PT Duration  12 weeks    PT Treatment/Interventions  Biofeedback;Cryotherapy;Electrical Stimulation;Ultrasound;Therapeutic activities;Therapeutic  exercise;Neuromuscular re-education;Patient/family education;Manual techniques;Dry needling;Scar mobilization    PT Next Visit Plan  fascial release of the pelvic floor, abdominal bracing, hip stretches for pelvic floor, pelvic floor meditation    Consulted and Agree with Plan of Care  Patient       Patient will benefit from skilled therapeutic intervention in order to improve the following deficits and impairments:  Increased fascial restricitons, Pain, Decreased coordination, Decreased scar mobility, Increased muscle spasms, Decreased activity tolerance, Decreased endurance, Decreased strength  Visit Diagnosis: Muscle weakness (generalized) - Plan: PT plan of care cert/re-cert  Other lack of coordination - Plan: PT plan of care cert/re-cert  Cramp and spasm - Plan: PT plan of care cert/re-cert  Coccyx pain - Plan: PT plan of care cert/re-cert     Problem List Patient Active Problem List   Diagnosis Date Noted  . Encounter for procreative genetic counseling 01/06/2016  . Depression with anxiety 10/22/2012  . 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 General medical examination 12/27/2010  . Allergic dermatitis 11/15/2010  . IBS 08/02/2008  . SCOLIOSIS 06/11/2006    Earlie Counts, PT 04/11/18 11:57 AM   [facility] [address], [place], [place], [address] Phone: [phone]   Fax:  [phone]  Name: NATALI LAVALLEE MRN: [id number] Date of Birth: 08/23/1990

## 2018-04-11 NOTE — Patient Instructions (Signed)
Moisturizers . They are used in the vagina to hydrate the mucous membrane that make up the vaginal canal. . Designed to keep a more normal acid balance (ph) . Once placed in the vagina, it will last between two to three days.  . Use 2-3 times per week at bedtime and last longer than 60 min. . Ingredients to avoid is glycerin and fragrance, can increase chance of infection . Should not be used just before sex due to causing irritation . Most are gels administered either in a tampon-shaped applicator or as a vaginal suppository. They are non-hormonal.   Types of Moisturizers . Samul Dada- drug store . Vitamin E vaginal suppositories- Whole foods, Amazon . Moist Again . Coconut oil- can break down condoms . Julva- (Do no use if on Tamoxifen) amazon . Yes moisturizer- amazon . NeuEve Silk , NeuEve Silver for menopausal or over 65 (if have severe vaginal atrophy or cancer treatments use NeuEve Silk for  1 month than move to The Pepsi)- [employer], MapleFlower.dk . Olive and Bee intimate cream- www.oliveandbee.com.au . Mae vaginal moisturizer- Amazon  Creams to use externally on the Vulva area  Albertson's (good for for cancer patients that had radiation to the area)- [place] or [employer].FlyingBasics.com.br  V-magic cream - amazon  Julva-amazon  Vital "V Wild Yam salve ( help moisturize and help with thinning vulvar area, does have Ivey by Damiva labial moisturizer ([place],    Things to avoid in the vaginal area . Do not use things to irritate the vulvar area . No lotions just specialized creams for the vulva area- Neogyn, V-magic, No soaps; can use Aveeno or Calendula cleanser if needed. Must be gentle . No deodorants . No douches . Good to sleep without underwear to let the vaginal area to air out . No scrubbing: spread the lips to let warm water rinse over labias and pat dry  start with using the  q-tip into the vaginal canal and hold there 2 min Massage the perineal body for 2 min daily.  [facility] [address] [place], [place] [address] Phone # [phone] Fax [phone]

## 2018-04-28 ENCOUNTER — Encounter: Payer: Self-pay | Admitting: Physical Therapy

## 2018-04-28 ENCOUNTER — Other Ambulatory Visit: Payer: Self-pay

## 2018-04-28 ENCOUNTER — Ambulatory Visit: Payer: BC Managed Care – PPO | Attending: Obstetrics and Gynecology | Admitting: Physical Therapy

## 2018-04-28 DIAGNOSIS — R278 Other lack of coordination: Secondary | ICD-10-CM | POA: Insufficient documentation

## 2018-04-28 DIAGNOSIS — R252 Cramp and spasm: Secondary | ICD-10-CM | POA: Diagnosis present

## 2018-04-28 DIAGNOSIS — M533 Sacrococcygeal disorders, not elsewhere classified: Secondary | ICD-10-CM | POA: Insufficient documentation

## 2018-04-28 DIAGNOSIS — M6281 Muscle weakness (generalized): Secondary | ICD-10-CM | POA: Insufficient documentation

## 2018-04-28 NOTE — Therapy (Addendum)
[facility] [address], [place], [place], [address] Phone: [phone]   Fax:  [phone]  Physical Therapy Treatment  Patient Details  Name: Karen Booker MRN: [id number] Date of Birth: October 05, 1990 Referring Provider (PT): Dr. Lucillie Garfinkel   Encounter Date: 04/28/2018  PT End of Session - 04/28/18 0927    Visit Number  2    Date for PT Re-Evaluation  07/04/18    Authorization Type  BCBS    PT Start Time  0845    PT Stop Time  0930    PT Time Calculation (min)  45 min    Activity Tolerance  Patient tolerated treatment well    Behavior During Therapy  [facility] for tasks assessed/performed       Past Medical History:  Diagnosis Date  . Anxiety   . Depression   . IBS (irritable bowel syndrome)   . Irritable bowel syndrome   . Neuromuscular disorder ([place])   . Other preterm infants, unspecified (weight)(765.10)   . Scoliosis (and kyphoscoliosis), idiopathic   . Urinary tract infection, site not specified   . UTI (urinary tract infection)     Past Surgical History:  Procedure Laterality Date  . WISDOM TOOTH EXTRACTION  2010    There were no vitals filed for this visit.  Subjective Assessment - 04/28/18 0846    Subjective  Patient reports she is using coconut oil on the perineum. Patient is able to use the q-tip and massage perineal body with less discomfort.     Patient Stated Goals  reduce pelvic pain, improve vaginal health, work on abdominals, work on tailbone    Currently in Pain?  Yes    Pain Score  2     Pain Location  Coccyx    Pain Orientation  Mid    Pain Descriptors / Indicators  Dull;Aching    Pain Type  Acute pain    Pain Onset  More than a month ago    Pain Frequency  Intermittent    Aggravating Factors   sitting, get up to release pressure from sitting, internal exam, sit on a memory foam mattress    Pain Relieving Factors  sit on a donut pillow    Multiple Pain Sites  No         [facility] PT  Assessment - 04/28/18 0001      Palpation   SI assessment   right ilium rotated anterior, sacrum rotated right                Pelvic Floor Special Questions - 04/28/18 0001    Skin Integrity  Intact   no erthema   Pelvic Floor Internal Exam  Patient confirms identification and approves PT to assess the pelvic floor and treatment    Exam Type  Vaginal    Palpation  tenderness located in the perineal body        [facility] Adult PT Treatment/Exercise - 04/28/18 0001      Lumbar Exercises: Stretches   Active Hamstring Stretch  Right;Left;1 rep;30 seconds   supine   Press Ups  10 reps    Quadruped Mid Back Stretch  3 reps;30 seconds   all three ways   Piriformis Stretch  Right;Left;1 rep;30 seconds   sitting   Other Lumbar Stretch Exercise  cat cow 10 x      Lumbar Exercises: Supine   Bridge  20 reps    Bridge Limitations  keeping hips leveled  Manual Therapy   Manual Therapy  Joint mobilization;Muscle Energy Technique;Soft tissue mobilization    Joint Mobilization  p-a mobilization to T10-L5, sacral mobilization to correct rotation    Soft tissue mobilization  along the perineal body to release the scar tissue    Muscle Energy Technique  correct right ilium             PT Education - 04/28/18 0917    Education Details  Access Code: [email or web address]     Person(s) Educated  Patient    Methods  Explanation;Verbal cues;Handout;Demonstration    Comprehension  Verbalized understanding;Returned demonstration       PT Short Term Goals - 04/11/18 1129      PT SHORT TERM GOAL #1   Title  independent with initial HEP with perineal massage    Time  4    Period  Weeks    Status  New    Target Date  05/09/18      PT SHORT TERM GOAL #2   Title  abilty to place the q-tip by the vagina with minimal to no pain due to improved tissue health    Time  4    Period  Weeks    Status  New    Target Date  05/09/18      PT SHORT TERM GOAL #3   Title  abilty to tolerate soft  tissue work to the left pelvic floor muscles to reduce trigger points    Time  4    Period  Weeks    Status  New    Target Date  05/09/18      PT SHORT TERM GOAL #4   Title  therapist able to assess the coccyx bone  to place in correct alignment    Time  4    Period  Weeks    Status  New    Target Date  05/09/18      PT SHORT TERM GOAL #5   Title  understand ways to sit to reduce coccyx bone with a towel roll at work    Time  4    Period  Weeks    Status  New    Target Date  05/09/18        PT Long Term Goals - 04/11/18 1148      PT LONG TERM GOAL #1   Title  independent with HEP and understand how to progress herself    Time  12    Period  Weeks    Status  New    Target Date  07/04/18      PT LONG TERM GOAL #2   Title  ability to have penile penetration with minimal to no pain due to reduction of trigger points and improved tissue health    Time  12    Period  Weeks    Status  New    Target Date  07/04/18      PT LONG TERM GOAL #3   Title  sit for 45 minutes with minimal to no discomfort without a pillow due to coccyx in correct position and improved tissue mobility    Time  12    Period  Weeks    Status  New    Target Date  07/04/18      PT LONG TERM GOAL #4   Title  increased abdominal strength so she is able to perform core strength correctly    Time  12    Period  Weeks      Status  New    Target Date  07/04/18      PT LONG TERM GOAL #5   Title  ability to run 1 mile with no pain due to improve core strength and reduction in pelvic pain    Time  12    Period  Weeks    Status  New    Target Date  07/04/18            Plan - 04/28/18 0918    Clinical Impression Statement  Patient is able to use the q-tip with minimal pain. Patient learned hip stretches to relax the pelvic floor. Patient pelvis is in correct alignment. Patient wil lbridge with left hip higher than the left. Patient had tightness in the lumbar spine. Patient perineal area is not red  anymore due to her using coconut oil to the tissue. Patient will benefit from skillled therapy to reduce pelvic pain, return to exercise and improve vaginal health and able to sit without a donut.     Rehab Potential  Excellent    Clinical Impairments Affecting Rehab Potential  none    PT Frequency  1x / week    PT Duration  12 weeks    PT Treatment/Interventions  Biofeedback;Cryotherapy;Electrical Stimulation;Ultrasound;Therapeutic activities;Therapeutic exercise;Neuromuscular re-education;Patient/family education;Manual techniques;Dry needling;Scar mobilization    PT Next Visit Plan  fascial release of the pelvic floor, pelvic floor meditation    PT Home Exercise Plan  Access Code: [email or web address]     Recommended Other Services  MD signed initial eval    Consulted and Agree with Plan of Care  Patient       Patient will benefit from skilled therapeutic intervention in order to improve the following deficits and impairments:  Increased fascial restricitons, Pain, Decreased coordination, Decreased scar mobility, Increased muscle spasms, Decreased activity tolerance, Decreased endurance, Decreased strength  Visit Diagnosis: Muscle weakness (generalized)  Other lack of coordination  Cramp and spasm  Coccyx pain     Problem List Patient Active Problem List   Diagnosis Date Noted  . Encounter for procreative genetic counseling 01/06/2016  . Depression with anxiety 10/22/2012  . 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 General medical examination 12/27/2010  . Allergic dermatitis 11/15/2010  . IBS 08/02/2008  . SCOLIOSIS 06/11/2006    Cheryl Gray, PT 04/28/18 9:31 AM   [facility] [address] [place], [place], [address] Phone: [phone]   Fax:  [phone]  Name: Karen Booker MRN: [id number] Date of Birth: 11/18/1990 PHYSICAL THERAPY DISCHARGE SUMMARY  Visits from Start of Care: 2  Current functional  level related to goals / functional outcomes: See above. Patient has not attended therapy since 04/28/2018 due to Covid-19. Patient spoke to our secretary today and did not want to come to therapy due to COVID-19 and is fine to be discharged. She will get a new script when she is ready to come back to therapy.    Remaining deficits: See above.    Education / Equipment: HEP Plan: Patient agrees to discharge.  Patient goals were not met. Patient is being discharged due to the patient's request.  Thank you for the referral. Cheryl Gray, PT 06/30/18 4:55 PM  ?????       

## 2018-04-28 NOTE — Patient Instructions (Signed)
Access Code: [email or web address]  URL: [email or web address]  Date: 04/28/2018  Prepared by: Earlie Counts   Exercises  Childs Pose - 2 reps - 1 sets - 30 sec hold - 1x daily - 7x weekly  Ardine Eng Pose Side Bend - 2 reps - 1 sets - 30 sec hold - 1x daily - 7x weekly  Cat-Camel - 10 reps - 1 sets - 1x daily - 7x weekly  Prone Press Up - 5 reps - 1 sets - 1x daily - 7x weekly  Supine Pelvic Floor Stretch - 1 reps - 1 sets - 1 min hold - 1x daily - 7x weekly  Supine Hamstring Stretch - 2 reps - 1 sets - 30 sec hold - 1x daily - 7x weekly  Seated Piriformis Stretch with Trunk Bend - 2 reps - 1 sets - 30 sec hold - 1x daily - 7x weekly  Supine Bridge - 15 reps - 1 sets - 1x daily - 7x weekly  [facility] [address] [place], [place] [address] Phone # [phone] Fax [phone]

## 2018-05-05 ENCOUNTER — Encounter: Payer: BC Managed Care – PPO | Admitting: Physical Therapy

## 2018-05-12 ENCOUNTER — Encounter: Payer: BC Managed Care – PPO | Admitting: Physical Therapy

## 2018-05-26 ENCOUNTER — Encounter: Payer: BC Managed Care – PPO | Admitting: Physical Therapy

## 2018-06-02 ENCOUNTER — Encounter: Payer: BC Managed Care – PPO | Admitting: Physical Therapy

## 2018-06-16 ENCOUNTER — Encounter: Payer: BC Managed Care – PPO | Admitting: Physical Therapy

## 2018-06-19 ENCOUNTER — Telehealth: Payer: Self-pay | Admitting: Physical Therapy

## 2018-06-19 NOTE — Telephone Encounter (Signed)
Called patient and unable to leave a message.  Earlie Counts, PT @5 /08/2018@ 5:21 PM

## 2018-06-23 ENCOUNTER — Encounter: Payer: BC Managed Care – PPO | Admitting: Physical Therapy

## 2018-06-30 ENCOUNTER — Ambulatory Visit: Payer: BC Managed Care – PPO | Admitting: Physical Therapy

## 2018-07-08 ENCOUNTER — Encounter: Payer: BC Managed Care – PPO | Admitting: Physical Therapy

## 2018-09-02 ENCOUNTER — Other Ambulatory Visit: Payer: Self-pay

## 2018-09-02 ENCOUNTER — Encounter: Payer: Self-pay | Admitting: Family Medicine

## 2018-09-02 ENCOUNTER — Ambulatory Visit (INDEPENDENT_AMBULATORY_CARE_PROVIDER_SITE_OTHER): Payer: BC Managed Care – PPO | Admitting: Family Medicine

## 2018-09-02 VITALS — Ht 65.0 in | Wt 119.0 lb

## 2018-09-02 DIAGNOSIS — G5622 Lesion of ulnar nerve, left upper limb: Secondary | ICD-10-CM

## 2018-09-02 DIAGNOSIS — G5602 Carpal tunnel syndrome, left upper limb: Secondary | ICD-10-CM

## 2018-09-02 NOTE — Progress Notes (Signed)
   Virtual Visit via Video   I connected with patient on 09/02/18 at 10:30 AM EDT by a video enabled telemedicine application and verified that I am speaking with the correct person using two identifiers.  Location patient: Home Location provider: Acupuncturist, Office Persons participating in the virtual visit: Patient, Provider, [place] (Jess B)  I discussed the limitations of evaluation and management by telemedicine and the availability of in person appointments. The patient expressed understanding and agreed to proceed.  Subjective:   HPI:   L arm pain- pt reports she developed pain and numbness of L arm starting last week.  Worse w/ bending elbow or putting pressure on elbow or forearm.  Had pain and tingling of 4th and 5th fingers.  Pain and numbness are improving, grip strength is improving.  ROS:   See pertinent positives and negatives per HPI.  Patient Active Problem List   Diagnosis Date Noted  . Encounter for procreative genetic counseling 01/06/2016  . Depression with anxiety 10/22/2012  . 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 General medical examination 12/27/2010  . Allergic dermatitis 11/15/2010  . IBS 08/02/2008  . SCOLIOSIS 06/11/2006    Social History   Tobacco Use  . Smoking status: Never Smoker  . Smokeless tobacco: Never Used  Substance Use Topics  . Alcohol use: Yes    Comment: Socially    Current Outpatient Medications:  .  acetaminophen (TYLENOL) 500 MG tablet, Take 500 mg by mouth every 6 (six) hours as needed for mild pain or headache., Disp: , Rfl:  .  Omega-3 Fatty Acids (FISH OIL) 1000 MG CAPS, Take 1 capsule by mouth daily., Disp: , Rfl:  .  Prenatal Vit-Fe Fumarate-FA (PRENATAL MULTIVITAMIN) TABS tablet, Take 1 tablet by mouth daily at 12 noon., Disp: , Rfl:   Allergies  Allergen Reactions  . Cephalexin     REACTION: Rash all over just before finishing the course last time.  . Nitrofurantoin     REACTION: rash and itching   . Other Hives and Other (See Comments)    Tree nuts- hives in mouth    Objective:   Ht 5\' 5"  (1.651 m)   Wt 119 lb (54 kg)   Breastfeeding No Comment: not breast feeding but still pumps  BMI 19.80 kg/m   AAOx3, NAD NCAT, EOMI No obvious CN deficits Coloring WNL Pt is able to speak clearly, coherently without shortness of breath or increased work of breathing.  Thought process is linear.  Mood is appropriate.   Assessment and Plan:   L ulnar radiculopathy- new.  Pt was excessively leaning on glass desk the day before it started hurting.  Encouraged her to be mindful of arm position.  Start NSAIDs, ice.  If no improvement pt to let me know.  L carpal tunnel- new.  Encouraged NSAIDs and OTC brace for pt to wear at night.  Pt to work on Gaffer up.  If no improvement pt to let me know so we can refer to hand surgeon.  Pt expressed understanding and is in agreement w/ plan.    Annye Asa, MD 09/02/2018

## 2018-09-02 NOTE — Progress Notes (Signed)
I have discussed the procedure for the virtual visit with the patient who has given consent to proceed with assessment and treatment.   Ingrid Shifrin L Vibha Ferdig, CMA     

## 2018-10-13 ENCOUNTER — Ambulatory Visit (INDEPENDENT_AMBULATORY_CARE_PROVIDER_SITE_OTHER): Payer: BC Managed Care – PPO | Admitting: Family Medicine

## 2018-10-13 ENCOUNTER — Encounter: Payer: Self-pay | Admitting: Family Medicine

## 2018-10-13 ENCOUNTER — Other Ambulatory Visit: Payer: Self-pay

## 2018-10-13 VITALS — Ht 65.0 in | Wt 117.0 lb

## 2018-10-13 DIAGNOSIS — F418 Other specified anxiety disorders: Secondary | ICD-10-CM | POA: Diagnosis not present

## 2018-10-13 MED ORDER — SERTRALINE HCL 25 MG PO TABS: 25.0000 mg | ORAL_TABLET | Freq: Every day | ORAL | 3 refills | Status: DC

## 2018-10-13 NOTE — Progress Notes (Signed)
I have discussed the procedure for the virtual visit with the patient who has given consent to proceed with assessment and treatment.   Feels she has some baby blues, has not begun xanax form GYN, stopped breast feeding, trouble with insomnia has tried melatonin to no avail.   Davis Gourd, CMA

## 2018-10-13 NOTE — Progress Notes (Signed)
   Virtual Visit via Video   I connected with patient on 10/13/18 at  9:30 AM EDT by a video enabled telemedicine application and verified that I am speaking with the correct person using two identifiers.  Location patient: Home Location provider: Acupuncturist, Office Persons participating in the virtual visit: Patient, Provider, [place] (Jess B)  I discussed the limitations of evaluation and management by telemedicine and the availability of in person appointments. The patient expressed understanding and agreed to proceed.  Subjective:   HPI:   Anxiety- pt was previously on Prozac and Sertraline but had 'side effects that I didn't like'.  Reached out to OB/GYN b/c she is feeling 'baby blues'.  Increased anxiety, insomnia, 'pit in my stomach', decreased appetite, unintentional weight loss.  Has weaned breast feeding.  GYN called in Xanax but she has not used this.  Has started Melatonin, Benadryl- not much help.  ROS:   See pertinent positives and negatives per HPI.  Patient Active Problem List   Diagnosis Date Noted  . Encounter for procreative genetic counseling 01/06/2016  . Depression with anxiety 10/22/2012  . 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 General medical examination 12/27/2010  . Allergic dermatitis 11/15/2010  . IBS 08/02/2008  . SCOLIOSIS 06/11/2006    Social History   Tobacco Use  . Smoking status: Never Smoker  . Smokeless tobacco: Never Used  Substance Use Topics  . Alcohol use: Yes    Comment: Socially    Current Outpatient Medications:  .  acetaminophen (TYLENOL) 500 MG tablet, Take 500 mg by mouth every 6 (six) hours as needed for mild pain or headache., Disp: , Rfl:  .  Omega-3 Fatty Acids (FISH OIL) 1000 MG CAPS, Take 1 capsule by mouth daily., Disp: , Rfl:  .  Prenatal Vit-Fe Fumarate-FA (PRENATAL MULTIVITAMIN) TABS tablet, Take 1 tablet by mouth daily at 12 noon., Disp: , Rfl:  .  ALPRAZolam (XANAX) 0.25 MG tablet, , Disp: , Rfl:    Allergies  Allergen Reactions  . Cephalexin     REACTION: Rash all over just before finishing the course last time.  . Nitrofurantoin     REACTION: rash and itching  . Other Hives and Other (See Comments)    Tree nuts- hives in mouth    Objective:   Ht 5\' 5"  (1.651 m)   Wt 117 lb (53.1 kg)   Breastfeeding No   BMI 19.47 kg/m   AAOx3, NAD NCAT, EOMI No obvious CN deficits Coloring WNL Pt is able to speak clearly, coherently without shortness of breath or increased work of breathing.  Thought process is linear.  Mood is appropriate.   Assessment and Plan:   Anxiety/Depression- Deteriorated.  May be related to hormonal changes associated w/ stopping breast feeding but may also be due to increased work stress and COVID stress.  She has had issues w/ this previously and was on both Prozac and Sertraline in the past.  Would prefer to restart Sertraline at low dose.  Will start SSRI and monitor closely for improvement.  Discussed appropriate use of Xanax- rescue situations only- and encouraged her to start w/ 1/2 tab.  Pt expressed understanding and is in agreement w/ plan.    Annye Asa, MD 10/13/2018

## 2018-11-14 ENCOUNTER — Encounter: Payer: Self-pay | Admitting: Family Medicine

## 2018-11-14 ENCOUNTER — Ambulatory Visit (INDEPENDENT_AMBULATORY_CARE_PROVIDER_SITE_OTHER): Payer: BC Managed Care – PPO | Admitting: Family Medicine

## 2018-11-14 VITALS — BP 127/86 | Ht 65.0 in | Wt 117.0 lb

## 2018-11-14 DIAGNOSIS — F418 Other specified anxiety disorders: Secondary | ICD-10-CM

## 2018-11-14 NOTE — Progress Notes (Signed)
   Virtual Visit via Video   I connected with patient on 11/14/18 at  4:00 PM EDT by a video enabled telemedicine application and verified that I am speaking with the correct person using two identifiers.  Location patient: Home Location provider: Acupuncturist, Office Persons participating in the virtual visit: Patient, Provider, [place] (Jess B)  I discussed the limitations of evaluation and management by telemedicine and the availability of in person appointments. The patient expressed understanding and agreed to proceed.  Subjective:   HPI:   Anxiety/depression- was started on Sertraline 25mg  at last visit.  For first 1-2 weeks had some bruising on upper thighs but this resolved and has not returned.  Reports feeling much better.  Pt reports feeling 'a lot calmer' no longer having chest tightness or knots in stomach.  Sleeping better.  ROS:   See pertinent positives and negatives per HPI.  Patient Active Problem List   Diagnosis Date Noted  . Encounter for procreative genetic counseling 01/06/2016  . Depression with anxiety 10/22/2012  . 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 General medical examination 12/27/2010  . Allergic dermatitis 11/15/2010  . IBS 08/02/2008  . SCOLIOSIS 06/11/2006    Social History   Tobacco Use  . Smoking status: Never Smoker  . Smokeless tobacco: Never Used  Substance Use Topics  . Alcohol use: Yes    Comment: Socially    Current Outpatient Medications:  .  acetaminophen (TYLENOL) 500 MG tablet, Take 500 mg by mouth every 6 (six) hours as needed for mild pain or headache., Disp: , Rfl:  .  ALPRAZolam (XANAX) 0.25 MG tablet, , Disp: , Rfl:  .  Omega-3 Fatty Acids (FISH OIL) 1000 MG CAPS, Take 1 capsule by mouth daily., Disp: , Rfl:  .  Prenatal Vit-Fe Fumarate-FA (PRENATAL MULTIVITAMIN) TABS tablet, Take 1 tablet by mouth daily at 12 noon., Disp: , Rfl:  .  sertraline (ZOLOFT) 25 MG tablet, Take 1 tablet (25 mg total) by mouth  daily., Disp: 30 tablet, Rfl: 3  Allergies  Allergen Reactions  . Cephalexin     REACTION: Rash all over just before finishing the course last time.  . Nitrofurantoin     REACTION: rash and itching  . Other Hives and Other (See Comments)    Tree nuts- hives in mouth    Objective:   BP 127/86   Ht 5\' 5"  (1.651 m)   Wt 117 lb (53.1 kg)   BMI 19.47 kg/m   AAOx3, NAD NCAT, EOMI No obvious CN deficits Coloring WNL Pt is able to speak clearly, coherently without shortness of breath or increased work of breathing.  Thought process is linear.  Mood is appropriate.   Assessment and Plan:   Anxiety/depression- much improved since starting Sertraline.  Very pleased w/ mood, sleep, and energy level.  No changes at this time.  Will follow.   Annye Asa, MD 11/14/2018

## 2018-12-26 ENCOUNTER — Ambulatory Visit (INDEPENDENT_AMBULATORY_CARE_PROVIDER_SITE_OTHER): Payer: BC Managed Care – PPO | Admitting: Family Medicine

## 2018-12-26 ENCOUNTER — Encounter: Payer: Self-pay | Admitting: Family Medicine

## 2018-12-26 ENCOUNTER — Other Ambulatory Visit: Payer: Self-pay

## 2018-12-26 ENCOUNTER — Encounter (HOSPITAL_COMMUNITY): Payer: Self-pay | Admitting: Emergency Medicine

## 2018-12-26 ENCOUNTER — Emergency Department (HOSPITAL_COMMUNITY)
Admission: EM | Admit: 2018-12-26 | Discharge: 2018-12-26 | Disposition: A | Discharge status: Home / Self Care | Payer: BC Managed Care – PPO | Attending: Emergency Medicine | Admitting: Emergency Medicine

## 2018-12-26 VITALS — Temp 98.7°F | Ht 65.0 in | Wt 118.0 lb

## 2018-12-26 DIAGNOSIS — R197 Diarrhea, unspecified: Secondary | ICD-10-CM

## 2018-12-26 DIAGNOSIS — R112 Nausea with vomiting, unspecified: Secondary | ICD-10-CM | POA: Insufficient documentation

## 2018-12-26 DIAGNOSIS — R109 Unspecified abdominal pain: Secondary | ICD-10-CM | POA: Insufficient documentation

## 2018-12-26 DIAGNOSIS — K625 Hemorrhage of anus and rectum: Secondary | ICD-10-CM | POA: Insufficient documentation

## 2018-12-26 LAB — COMPREHENSIVE METABOLIC PANEL
ALT: 26 U/L (ref 0–44)
AST: 26 U/L (ref 15–41)
Albumin: 4.9 g/dL (ref 3.5–5.0)
Alkaline Phosphatase: 74 U/L (ref 38–126)
Anion gap: 9 (ref 5–15)
BUN: 10 mg/dL (ref 6–20)
CO2: 27 mmol/L (ref 22–32)
Calcium: 9.9 mg/dL (ref 8.9–10.3)
Chloride: 102 mmol/L (ref 98–111)
Creatinine, Ser: 0.66 mg/dL (ref 0.44–1.00)
GFR calc Af Amer: >60 mL/min (ref >60)
GFR calc non Af Amer: >60 mL/min (ref >60)
Glucose, Bld: 133 mg/dL — ABNORMAL HIGH (ref 70–99)
Potassium: 4.1 mmol/L (ref 3.5–5.1)
Sodium: 138 mmol/L (ref 135–145)
Total Bilirubin: 1.5 mg/dL — ABNORMAL HIGH (ref 0.3–1.2)
Total Protein: 7.7 g/dL (ref 6.5–8.1)

## 2018-12-26 LAB — I-STAT BETA HCG BLOOD, ED (MC, WL, AP ONLY): I-stat hCG, quantitative: <5 m[IU]/mL (ref <5)

## 2018-12-26 LAB — URINALYSIS, ROUTINE W REFLEX MICROSCOPIC
Bilirubin Urine: NEGATIVE
Glucose, UA: NEGATIVE mg/dL
Hgb urine dipstick: NEGATIVE
Ketones, ur: NEGATIVE mg/dL
Leukocytes,Ua: NEGATIVE
Nitrite: NEGATIVE
Protein, ur: NEGATIVE mg/dL
Specific Gravity, Urine: 1.008 (ref 1.005–1.030)
pH: 7 (ref 5.0–8.0)

## 2018-12-26 LAB — CBC
HCT: 46 % (ref 36.0–46.0)
Hemoglobin: 15.5 g/dL — ABNORMAL HIGH (ref 12.0–15.0)
MCH: 32.8 pg (ref 26.0–34.0)
MCHC: 33.7 g/dL (ref 30.0–36.0)
MCV: 97.5 fL (ref 80.0–100.0)
Platelets: 361 10*3/uL (ref 150–400)
RBC: 4.72 MIL/uL (ref 3.87–5.11)
RDW: 11.4 % — ABNORMAL LOW (ref 11.5–15.5)
WBC: 9 10*3/uL (ref 4.0–10.5)
nRBC: 0 % (ref 0.0–0.2)

## 2018-12-26 LAB — ABO/RH: ABO/RH(D): O POS

## 2018-12-26 LAB — TYPE AND SCREEN
ABO/RH(D): O POS
Antibody Screen: NEGATIVE

## 2018-12-26 LAB — LIPASE, BLOOD: Lipase: 27 U/L (ref 11–51)

## 2018-12-26 MED ORDER — SODIUM CHLORIDE 0.9% FLUSH
3.0000 mL | Freq: Once | INTRAVENOUS | Status: AC
  Administered 2018-12-26: 3 mL via INTRAVENOUS

## 2018-12-26 MED ORDER — DICYCLOMINE HCL 20 MG PO TABS: 20.0000 mg | ORAL_TABLET | Freq: Four times a day (QID) | ORAL | 0 refills | Status: DC | PRN

## 2018-12-26 NOTE — Progress Notes (Signed)
I have discussed the procedure for the virtual visit with the patient who has given consent to proceed with assessment and treatment.   Jessica L Brodmerkel, CMA     

## 2018-12-26 NOTE — ED Triage Notes (Signed)
Patient complaining of lower abdominal pain, nausea, vomiting, and bloody diarrhea since 7 pm tonight.

## 2018-12-26 NOTE — ED Notes (Signed)
Pt was verbalized discharge instructions. Pt had no further questions at this time. NAD. 

## 2018-12-26 NOTE — Progress Notes (Signed)
Virtual Visit via Video   I connected with patient on 12/26/18 at 11:00 AM EST by a video enabled telemedicine application and verified that I am speaking with the correct person using two identifiers.  Location patient: Home Location provider: Acupuncturist, Office Persons participating in the virtual visit: Patient, Provider, [place] (Jess B)  I discussed the limitations of evaluation and management by telemedicine and the availability of in person appointments. The patient expressed understanding and agreed to proceed.  Subjective:   HPI:   ER f/u- pt went to ER overnight due to abdominal pain and diarrhea.  Pt reports feeling 'really severe stomach cramping last night w/ diarrhea, nausea, and vomiting.'  Had blood in diarrhea around 11pm which prompted ER visit.  Had diarrhea again in ER that had no blood.  Pt reports feeling somewhat better this AM.  No fever.  Husband and baby are fine.  Has not eaten anything different from rest of family.  Abd pain is much better.  Nausea has improved, 'only vomited twice'.  Had 'a little bit of diarrhea' at 7am.  No blood this AM.  Blood last night was bright red, poop mixed w/ blood.    ROS:   See pertinent positives and negatives per HPI.  Patient Active Problem List   Diagnosis Date Noted  . Encounter for procreative genetic counseling 01/06/2016  . Depression with anxiety 10/22/2012  . 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 General medical examination 12/27/2010  . Allergic dermatitis 11/15/2010  . IBS 08/02/2008  . SCOLIOSIS 06/11/2006    Social History   Tobacco Use  . Smoking status: Never Smoker  . Smokeless tobacco: Never Used  Substance Use Topics  . Alcohol use: Yes    Comment: Socially    Current Outpatient Medications:  .  acetaminophen (TYLENOL) 500 MG tablet, Take 500 mg by mouth every 6 (six) hours as needed for mild pain or headache., Disp: , Rfl:  .  alum & mag hydroxide-simeth (MAALOX/MYLANTA)  200-200-20 MG/5ML suspension, Take 30 mLs by mouth every 6 (six) hours as needed for indigestion or heartburn., Disp: , Rfl:  .  bismuth subsalicylate (PEPTO BISMOL) 262 MG chewable tablet, Chew 524 mg by mouth as needed., Disp: , Rfl:  .  LAVENDER OIL PO, Take 1 capsule by mouth daily., Disp: , Rfl:  .  Omega-3 Fatty Acids (FISH OIL) 1000 MG CAPS, Take 1 capsule by mouth daily., Disp: , Rfl:  .  Prenatal Vit-Fe Fumarate-FA (PRENATAL MULTIVITAMIN) TABS tablet, Take 1 tablet by mouth daily at 12 noon., Disp: , Rfl:  .  sertraline (ZOLOFT) 25 MG tablet, Take 1 tablet (25 mg total) by mouth daily., Disp: 30 tablet, Rfl: 3 .  dicyclomine (BENTYL) 20 MG tablet, Take 1 tablet (20 mg total) by mouth every 6 (six) hours as needed for spasms. (Patient not taking: Reported on 12/26/2018), Disp: 30 tablet, Rfl: 0  Allergies  Allergen Reactions  . Cephalexin     REACTION: Rash all over just before finishing the course last time.  . Nitrofurantoin     REACTION: rash and itching  . Other Hives and Other (See Comments)    Tree nuts- hives in mouth    Objective:   Temp 98.7 F (37.1 C) (Temporal)   Ht 5\' 5"  (1.651 m)   Wt 118 lb (53.5 kg)   Breastfeeding No   BMI 19.64 kg/m   AAOx3, NAD NCAT, EOMI No obvious CN deficits Coloring WNL Pt is able to  speak clearly, coherently without shortness of breath or increased work of breathing.  Thought process is linear.  Mood is appropriate.   Assessment and Plan:   Diarrhea- new.  Presumed infectious.  The rapid onset followed by rapid improvement is consistent w/ bacterial illness.  Hopefully she has expelled the toxin and her sxs will continue to improve.  Discussed need for bland diet, increased fluids, and if she again has bleeding to let us know ASAP.  ER labs and notes reviewed.  No additional workup at this time.  Reviewed supportive care and red flags that should prompt return.  Pt expressed understanding and is in agreement w/ plan.     Annye Asa, MD 12/26/2018

## 2018-12-27 NOTE — ED Provider Notes (Signed)
Emergency Department Provider Note   I have reviewed the triage vital signs and the nursing notes.   HISTORY  Chief Complaint Abdominal Pain and Rectal Bleeding   HPI Karen Booker is a 28 y.o. female who presents the emergency department today with diarrhea.  Patient states that she had multiple episodes of diarrhea over a few hours and had one episode where there was some blood mixed in with it.  She had a few episodes of diarrhea since then with abdominal cramping with no blood.  She states is improving now.  She is on had 2 bowel movements in the last few hours here in the emergency room.  No fevers.  No sick contacts.  No one else got sick with the food she ate.  She had some nausea with it and one episode of vomiting that was nonbloody nonbilious.  No recent travels.   No other associated or modifying symptoms.    Past Medical History:  Diagnosis Date  . Anxiety   . Depression   . IBS (irritable bowel syndrome)   . Irritable bowel syndrome   . Neuromuscular disorder ([place])   . Other preterm infants, unspecified (weight)(765.10)   . Scoliosis (and kyphoscoliosis), idiopathic   . Urinary tract infection, site not specified   . UTI (urinary tract infection)     Patient Active Problem List   Diagnosis Date Noted  . Encounter for procreative genetic counseling 01/06/2016  . Depression with anxiety 10/22/2012  . 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 General medical examination 12/27/2010  . Allergic dermatitis 11/15/2010  . IBS 08/02/2008  . SCOLIOSIS 06/11/2006    Past Surgical History:  Procedure Laterality Date  . WISDOM TOOTH EXTRACTION  2010    Current Outpatient Rx  . Order #: [id number] Class: Historical Med  . Order #: [id number] Class: Historical Med  . Order #: [id number] Class: Historical Med  . Order #: [id number] Class: Historical Med  . Order #: [id number] Class: Historical Med  . Order #: [id number] Class: Historical Med  . Order #: [id number] Class:  Normal  . Order #: [id number] Class: Normal    Allergies Cephalexin, Nitrofurantoin, and Other  Family History  Problem Relation Age of Onset  . Mental illness Sister   . Hyperlipidemia Mother   . Alcohol abuse Father   . Arthritis Maternal Grandmother   . Diabetes Maternal Grandmother   . Cancer Paternal Grandfather     Social History Social History   Tobacco Use  . Smoking status: Never Smoker  . Smokeless tobacco: Never Used  Substance Use Topics  . Alcohol use: Yes    Comment: Socially  . Drug use: No    Review of Systems  All other systems negative except as documented in the HPI. All pertinent positives and negatives as reviewed in the HPI. ____________________________________________   PHYSICAL EXAM:  VITAL SIGNS: ED Triage Vitals  Enc Vitals Group     BP 12/26/18 0009 115/82     Pulse Rate 12/26/18 0009 91     Resp 12/26/18 0009 16     Temp 12/26/18 0009 98.1 F (36.7 C)     Temp Source 12/26/18 0009 Oral     SpO2 12/26/18 0009 100 %     Weight 12/26/18 0009 118 lb (53.5 kg)     Height 12/26/18 0009 5\' 5"  (1.651 m)    Constitutional: Alert and oriented. Well appearing and in no acute distress. Eyes: Conjunctivae are normal. PERRL. EOMI. Head: Atraumatic. Nose: No congestion/rhinnorhea. Mouth/Throat: Mucous membranes are  moist.  Oropharynx non-erythematous. Neck: No stridor.  No meningeal signs.   Cardiovascular: Normal rate, regular rhythm. Good peripheral circulation. Grossly normal heart sounds.   Respiratory: Normal respiratory effort.  No retractions. Lungs CTAB. Gastrointestinal: Soft and nontender. No distention.  Musculoskeletal: No lower extremity tenderness nor edema. No gross deformities of extremities. Neurologic:  Normal speech and language. No gross focal neurologic deficits are appreciated.  Skin:  Skin is warm, dry and intact. No rash noted.   ____________________________________________   LABS (all labs ordered are listed, but  only abnormal results are displayed)  Labs Reviewed  COMPREHENSIVE METABOLIC PANEL - Abnormal; Notable for the following components:      Result Value   Glucose, Bld 133 (*)    Total Bilirubin 1.5 (*)    All other components within normal limits  CBC - Abnormal; Notable for the following components:   Hemoglobin 15.5 (*)    RDW 11.4 (*)    All other components within normal limits  LIPASE, BLOOD  URINALYSIS, ROUTINE W REFLEX MICROSCOPIC  I-STAT BETA HCG BLOOD, ED ([facility], [facility], [facility] ONLY)  TYPE AND SCREEN  ABO/RH   ____________________________________________  INITIAL IMPRESSION / ASSESSMENT AND PLAN / ED COURSE  Discussed doing CT scan versus GI studies versus symptomatic care.  Patient states she started to feel better and the diarrhea is improved her cramping is improved and and she prefers to go home at this time return if things worsen.  Her abdomen is benign and does not seem surgical in nature.  Could be viral or possibly bacterial but will hold on antibiotics at this time without a definitive source and since it is improving.  Discussed not using Imodium for this and instead using probiotics and fiber.  Pertinent labs & imaging results that were available during my care of the patient were reviewed by me and considered in my medical decision making (see chart for details).  A medical screening exam was performed and I feel the patient has had an appropriate workup for their chief complaint at this time and likelihood of emergent condition existing is low. They have been counseled on decision, discharge, follow up and which symptoms necessitate immediate return to the emergency department. They or their family verbally stated understanding and agreement with plan and discharged in stable condition.   ____________________________________________  FINAL CLINICAL IMPRESSION(S) / ED DIAGNOSES  Final diagnoses:  Diarrhea, unspecified type    MEDICATIONS GIVEN DURING THIS VISIT:   Medications  sodium chloride flush (NS) 0.9 % injection 3 mL (3 mLs Intravenous Given 12/26/18 0047)     NEW OUTPATIENT MEDICATIONS STARTED DURING THIS VISIT:  Discharge Medication List as of 12/26/2018  2:56 AM      Note:  This note was prepared with assistance of Dragon voice recognition software. Occasional wrong-word or sound-a-like substitutions may have occurred due to the inherent limitations of voice recognition software.   Daphene Chisholm, Corene Cornea, MD 12/27/18 [phone]

## 2019-01-19 ENCOUNTER — Ambulatory Visit (INDEPENDENT_AMBULATORY_CARE_PROVIDER_SITE_OTHER): Payer: BC Managed Care – PPO | Admitting: Family Medicine

## 2019-01-19 ENCOUNTER — Other Ambulatory Visit: Payer: Self-pay

## 2019-01-19 ENCOUNTER — Encounter: Payer: Self-pay | Admitting: Family Medicine

## 2019-01-19 VITALS — BP 114/63 | HR 94 | Ht 65.0 in | Wt 120.0 lb

## 2019-01-19 DIAGNOSIS — Z0000 Encounter for general adult medical examination without abnormal findings: Secondary | ICD-10-CM | POA: Diagnosis not present

## 2019-01-19 MED ORDER — SERTRALINE HCL 25 MG PO TABS: 25.0000 mg | ORAL_TABLET | Freq: Every day | ORAL | 3 refills | Status: DC

## 2019-01-19 NOTE — Progress Notes (Signed)
I have discussed the procedure for the virtual visit with the patient who has given consent to proceed with assessment and treatment.   Jessica L Brodmerkel, CMA     

## 2019-01-19 NOTE — Progress Notes (Signed)
Virtual Visit via Video   I connected with patient on 01/19/19 at  9:00 AM EST by a video enabled telemedicine application and verified that I am speaking with the correct person using two identifiers.  Location patient: Home Location provider: Acupuncturist, Office Persons participating in the virtual visit: Patient, Provider, [place] (Jess B)  I discussed the limitations of evaluation and management by telemedicine and the availability of in person appointments. The patient expressed understanding and agreed to proceed.  Subjective:   HPI:   CPE- pap scheduled.  UTD on immunizations.    ROS:  Patient reports no vision/ hearing changes, adenopathy,fever, weight change,  persistant/recurrent hoarseness , swallowing issues, chest pain, palpitations, edema, persistant/recurrent cough, hemoptysis, dyspnea (rest/exertional/paroxysmal nocturnal), gastrointestinal bleeding (melena, rectal bleeding), abdominal pain, significant heartburn, bowel changes, GU symptoms (dysuria, hematuria, incontinence), Gyn symptoms (abnormal  bleeding, pain),  syncope, focal weakness, memory loss, numbness & tingling, skin/hair/nail changes, abnormal bruising or bleeding, anxiety, or depression.   Patient Active Problem List   Diagnosis Date Noted  . Encounter for procreative genetic counseling 01/06/2016  . Depression with anxiety 10/22/2012  . 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 General medical examination 12/27/2010  . Allergic dermatitis 11/15/2010  . IBS 08/02/2008  . SCOLIOSIS 06/11/2006    Social History   Tobacco Use  . Smoking status: Never Smoker  . Smokeless tobacco: Never Used  Substance Use Topics  . Alcohol use: Yes    Comment: Socially    Current Outpatient Medications:  .  acetaminophen (TYLENOL) 500 MG tablet, Take 500 mg by mouth every 6 (six) hours as needed for mild pain or headache., Disp: , Rfl:  .  alum & mag hydroxide-simeth (MAALOX/MYLANTA) 200-200-20 MG/5ML  suspension, Take 30 mLs by mouth every 6 (six) hours as needed for indigestion or heartburn., Disp: , Rfl:  .  bismuth subsalicylate (PEPTO BISMOL) 262 MG chewable tablet, Chew 524 mg by mouth as needed., Disp: , Rfl:  .  LAVENDER OIL PO, Take 1 capsule by mouth daily., Disp: , Rfl:  .  Omega-3 Fatty Acids (FISH OIL) 1000 MG CAPS, Take 1 capsule by mouth daily., Disp: , Rfl:  .  Prenatal Vit-Fe Fumarate-FA (PRENATAL MULTIVITAMIN) TABS tablet, Take 1 tablet by mouth daily at 12 noon., Disp: , Rfl:  .  sertraline (ZOLOFT) 25 MG tablet, Take 1 tablet (25 mg total) by mouth daily., Disp: 30 tablet, Rfl: 3 .  dicyclomine (BENTYL) 20 MG tablet, Take 1 tablet (20 mg total) by mouth every 6 (six) hours as needed for spasms. (Patient not taking: Reported on 12/26/2018), Disp: 30 tablet, Rfl: 0  Allergies  Allergen Reactions  . Cephalexin     REACTION: Rash all over just before finishing the course last time.  . Nitrofurantoin     REACTION: rash and itching  . Other Hives and Other (See Comments)    Tree nuts- hives in mouth    Objective:   BP 114/63   Pulse 94   Ht 5\' 5"  (1.651 m)   Wt 120 lb (54.4 kg)   Breastfeeding No   BMI 19.97 kg/m  AAOx3, NAD NCAT, EOMI No obvious CN deficits Coloring WNL Pt is able to speak clearly, coherently without shortness of breath or increased work of breathing.  Thought process is linear.  Mood is appropriate.   Assessment and Plan:   CPE- pt is UTD on immunizations, has pap scheduled.  Doing well physically and emotionally.  No need for labs.  Anticipatory guidance provided.    Annye Asa, MD 01/19/2019

## 2019-01-21 ENCOUNTER — Telehealth: Payer: Self-pay

## 2019-01-21 NOTE — Telephone Encounter (Signed)
LMOVM advising patient to schedule CPE in 1 year

## 2019-08-25 ENCOUNTER — Other Ambulatory Visit: Payer: Self-pay | Admitting: Family Medicine

## 2019-08-28 ENCOUNTER — Other Ambulatory Visit: Payer: Self-pay | Admitting: Family Medicine

## 2019-09-17 ENCOUNTER — Other Ambulatory Visit: Payer: Self-pay

## 2019-09-17 ENCOUNTER — Ambulatory Visit: Payer: BC Managed Care – PPO | Admitting: Family Medicine

## 2019-09-17 ENCOUNTER — Encounter: Payer: Self-pay | Admitting: Neurology

## 2019-09-17 ENCOUNTER — Encounter: Payer: Self-pay | Admitting: Family Medicine

## 2019-09-17 VITALS — BP 121/80 | HR 101 | Temp 98.5°F | Resp 16 | Ht 65.0 in | Wt 127.0 lb

## 2019-09-17 DIAGNOSIS — G43109 Migraine with aura, not intractable, without status migrainosus: Secondary | ICD-10-CM | POA: Diagnosis not present

## 2019-09-17 NOTE — Progress Notes (Signed)
   Subjective:    Patient ID: Karen Booker, female    DOB: [birth year], 29 y.o.   MRN: [id number]  HPI Migraines- 'i've never talked to any doctor about it'.  Pt reports HAs are increasing in frequency and severity.  + aura- starts w/ a 'crescent in my field of vision'.  Aura will last 10-30 minutes.  Then develops mild HA but fatigue and nausea.  The HA and associated sxs last another ~30 minutes.  Currently on lo-loestrin.  Started this spring.  Feels HAs changed around the same time that OCPs were initiated.  Pt reports good water intake.  Currently migraines are occurring ~1x/month.  Has not tracked to correlate w/ cycle.  Last HA was 1 week ago w/ no known trigger.   Review of Systems For ROS see HPI   This visit occurred during the SARS-CoV-2 public health emergency.  Safety protocols were in place, including screening questions prior to the visit, additional usage of staff PPE, and extensive cleaning of exam room while observing appropriate contact time as indicated for disinfecting solutions.       Objective:   Physical Exam Vitals reviewed.  Constitutional:      General: She is not in acute distress.    Appearance: Normal appearance. She is not ill-appearing.  HENT:     Head: Normocephalic and atraumatic.  Eyes:     Extraocular Movements: Extraocular movements intact.     Conjunctiva/sclera: Conjunctivae normal.     Pupils: Pupils are equal, round, and reactive to light.  Skin:    General: Skin is warm and dry.  Neurological:     General: No focal deficit present.     Mental Status: She is alert and oriented to person, place, and time.     Cranial Nerves: No cranial nerve deficit.     Motor: No weakness.     Gait: Gait normal.  Psychiatric:        Mood and Affect: Mood normal.        Behavior: Behavior normal.        Thought Content: Thought content normal.           Assessment & Plan:

## 2019-09-17 NOTE — Patient Instructions (Signed)
Follow up as needed or as scheduled We'll call you with your Neurology appt to assess the worsening migraines Keep a headache log to determine if there are any patterns to the migraines STOP the birth control Schedule a follow up w/ GYN to discuss birth control going forward Call with any questions or concerns Stay Safe!  Stay Healthy!

## 2019-09-17 NOTE — Assessment & Plan Note (Signed)
New to provider, ongoing for pt.  She reports she previously would have a migraine 'every few years' and then 'yearly', and then 'every couple months', and then 'monthly'.  Feels worsening of sxs correlates to starting OCPs.  Discussed that she should not take estrogen containing OCPs since she has an aura.  Pt is ok w/ stopping OCPs.  Will schedule appt w/ GYN to discuss birth control going forward.  Will refer to Neuro given the worsening of her sxs.  Reviewed supportive care and red flags that should prompt return.  Pt expressed understanding and is in agreement w/ plan.

## 2019-12-01 ENCOUNTER — Telehealth: Payer: Self-pay | Admitting: Family Medicine

## 2019-12-01 MED ORDER — SERTRALINE HCL 25 MG PO TABS: 25.0000 mg | ORAL_TABLET | Freq: Every day | ORAL | 0 refills | Status: DC

## 2019-12-01 NOTE — Telephone Encounter (Signed)
Medication filled to pharmacy as requested.   

## 2019-12-01 NOTE — Telephone Encounter (Signed)
Medication Refills  Medication: Sertaline  Pharmacy  [facility] at [facility] ** Let patient know to contact pharmacy at the end of the day to make sure medication is ready.**  ** Please notify patient to allow 48-72 hours to process.**  ** Encourage patient to contact the pharmacy for refills or they can request refills through [facility]**  Clinical Fills out below:   Last refill:  QTY:  Refill Date:    Other Comments:   Okay for refill?  Please advise.

## 2019-12-08 NOTE — Progress Notes (Signed)
NEUROLOGY CONSULTATION NOTE  Karen Booker MRN: [id number] DOB: 11/30/1990  Referring provider: Annye Asa, MD Primary care provider: Annye Asa, MD  Reason for consult:  migraines  HISTORY OF PRESENT ILLNESS: Karen Booker is a 29 year old right-handed female with IBS, anxiety and depression who presents for migraines.  History supplemented by referring provider's note.  She has had migraines since age 76.  They have increased in frequency since this past Spring around the time she started lo-loestrin after weaning her baby off of breastfeeding.  She first experiences a visual aura described as a crescent shape in her right eye that enlarges and moves out of her visual field lasting 10 to 30 minutes followed by a mild primarily right-sided pressure headache.  Headache lasts 1 to 2 hours.  Initially occurred about once a year to once every few years.  Then, they were occurring once a month.  Unsure if it correlated with her period because she has frequent breakthrough bleeding. There is associated nausea and fatigue.  There is no associated photophobia, phonophobia, numbness or weakness.  Unaware of any specific trigger.  She would also have some headaches without aura about once a week.    She followed up with her PCP in August, who advised her to stop her OCP.  She was switched to a progesterone-only pill and no migraine since.   Current NSAIDS/analgesics:  Tylenol Current triptans:  none Current ergotamine:  none Current anti-emetic:  none Current muscle relaxants:  none Current Antihypertensive medications:  none Current Antidepressant medications:  Sertraline 25mg  daily Current Anticonvulsant medications:  none Current anti-CGRP:  none Current Vitamins/Herbal/Supplements:  Fish oil, prenatal MVI Current Antihistamines/Decongestants:  none Other therapy:  none Hormone/birth control:  none  Past NSAIDS/analgesics:  none Past abortive triptans:  none Past abortive  ergotamine:  none Past muscle relaxants:  none Past anti-emetic:  none Past antihypertensive medications:  none Past antidepressant medications:  none Past anticonvulsant medications:  none Past anti-CGRP:  none Past vitamins/Herbal/Supplements:  none Past antihistamines/decongestants:  none Other past therapies:  none  Caffeine:  2 cups coffee daily, 1 to 2 times a week a diet Coke or tea Diet:  40 oz water daily.  Skips lunch. Exercise:  No routine Depression:  no; Anxiety:  Yes.  Controlled on sertraline Other pain:  no Sleep hygiene:  good Family history of headache:  Mom (migriane), twin sister (migraine with aura)  CBC and CMP from November 2020 unremarkable except for elevated glucose 133.   PAST MEDICAL HISTORY: Past Medical History:  Diagnosis Date  . Anxiety   . Depression   . IBS (irritable bowel syndrome)   . Irritable bowel syndrome   . Neuromuscular disorder ([place])   . Other preterm infants, unspecified (weight)(765.10)   . Scoliosis (and kyphoscoliosis), idiopathic   . Urinary tract infection, site not specified   . UTI (urinary tract infection)     PAST SURGICAL HISTORY: Past Surgical History:  Procedure Laterality Date  . WISDOM TOOTH EXTRACTION  2010    MEDICATIONS: Current Outpatient Medications on File Prior to Visit  Medication Sig Dispense Refill  . acetaminophen (TYLENOL) 500 MG tablet Take 500 mg by mouth every 6 (six) hours as needed for mild pain or headache.    . ALPRAZolam (XANAX) 0.25 MG tablet alprazolam 0.25 mg tablet    . alum & mag hydroxide-simeth (MAALOX/MYLANTA) 200-200-20 MG/5ML suspension Take 30 mLs by mouth every 6 (six) hours as needed for indigestion or heartburn.    Marland Kitchen  bismuth subsalicylate (PEPTO BISMOL) 262 MG chewable tablet Chew 524 mg by mouth as needed.    Marland Kitchen LAVENDER OIL PO Take 1 capsule by mouth daily.    . Omega-3 Fatty Acids (FISH OIL) 1000 MG CAPS Take 1 capsule by mouth daily.    . Prenatal Vit-Fe Fumarate-FA  (PRENATAL MULTIVITAMIN) TABS tablet Take 1 tablet by mouth daily at 12 noon.    . sertraline (ZOLOFT) 25 MG tablet Take 1 tablet (25 mg total) by mouth daily. 90 tablet 0   No current facility-administered medications on file prior to visit.    ALLERGIES: Allergies  Allergen Reactions  . Cephalexin     REACTION: Rash all over just before finishing the course last time.  . Nitrofurantoin     REACTION: rash and itching  . Other Hives and Other (See Comments)    Tree nuts- hives in mouth    FAMILY HISTORY: Family History  Problem Relation Age of Onset  . Mental illness Sister   . Hyperlipidemia Mother   . Alcohol abuse Father   . Arthritis Maternal Grandmother   . Diabetes Maternal Grandmother   . Cancer Paternal Grandfather    SOCIAL HISTORY: Social History   Socioeconomic History  . Marital status: Married    Spouse name: Not on file  . Number of children: Not on file  . Years of education: Not on file  . Highest education level: Not on file  Occupational History  . Occupation: Ship broker at [employer]  . Smoking status: Never Smoker  . Smokeless tobacco: Never Used  Substance and Sexual Activity  . Alcohol use: Yes    Comment: Socially  . Drug use: No  . Sexual activity: Yes    Birth control/protection: Pill  Other Topics Concern  . Not on file  Social History Narrative  . Not on file   Social Determinants of Health   Financial Resource Strain:   . Difficulty of Paying Living Expenses: Not on file  Food Insecurity:   . Worried About Charity fundraiser in the Last Year: Not on file  . Ran Out of Food in the Last Year: Not on file  Transportation Needs:   . Lack of Transportation (Medical): Not on file  . Lack of Transportation (Non-Medical): Not on file  Physical Activity:   . Days of Exercise per Week: Not on file  . Minutes of Exercise per Session: Not on file  Stress:   . Feeling of Stress : Not on file  Social Connections:   .  Frequency of Communication with Friends and Family: Not on file  . Frequency of Social Gatherings with Friends and Family: Not on file  . Attends Religious Services: Not on file  . Active Member of Clubs or Organizations: Not on file  . Attends Archivist Meetings: Not on file  . Marital Status: Not on file  Intimate Partner Violence:   . Fear of Current or Ex-Partner: Not on file  . Emotionally Abused: Not on file  . Physically Abused: Not on file  . Sexually Abused: Not on file    PHYSICAL EXAM: Blood pressure 136/81, pulse (!) 105, height 5\' 5"  (1.651 m), weight 127 lb 9.6 oz (57.9 kg), SpO2 100 %. General: No acute distress.  Patient appears well-groomed.  Head:  Normocephalic/atraumatic Eyes:  fundi examined but not visualized Neck: supple, no paraspinal tenderness, full range of motion Back: No paraspinal tenderness Heart: regular rate and rhythm Lungs: Clear to  auscultation bilaterally. Vascular: No carotid bruits. Neurological Exam: Mental status: alert and oriented to person, place, and time, recent and remote memory intact, fund of knowledge intact, attention and concentration intact, speech fluent and not dysarthric, language intact. Cranial nerves: CN I: not tested CN II: pupils equal, round and reactive to light, visual fields intact CN III, IV, VI:  full range of motion, no nystagmus, no ptosis CN V: facial sensation intact CN VII: upper and lower face symmetric CN VIII: hearing intact CN IX, X: gag intact, uvula midline CN XI: sternocleidomastoid and trapezius muscles intact CN XII: tongue midline Bulk & Tone: normal, no fasciculations. Motor:  5/5 throughout  Sensation:  Pinprick and vibration sensation intact. Deep Tendon Reflexes:  2+ throughout, toes downgoing.  Finger to nose testing:  Without dysmetria.  Heel to shin:  Without dysmetria.  Gait:  Normal station and stride.  Able to turn and tandem walk. Romberg negative.  IMPRESSION: Migraine  with aura, without status migrainosus, not intractable.  Aggravated by estrogen-based birth control.  Would not use estrogen birth control given her associated aura.  PLAN: 1.  Preventative medication not indicated. 2. Limit use of pain relievers to no more than 2 days out of week to prevent risk of rebound or medication-overuse headache. 3. If migraines increase or become longer duration, she is to follow up.   Thank you for allowing me to take part in the care of this patient.  Metta Clines, DO  CC: Annye Asa, MD

## 2019-12-09 ENCOUNTER — Ambulatory Visit: Payer: BC Managed Care – PPO | Admitting: Neurology

## 2019-12-09 ENCOUNTER — Encounter: Payer: Self-pay | Admitting: Neurology

## 2019-12-09 ENCOUNTER — Other Ambulatory Visit: Payer: Self-pay

## 2019-12-09 VITALS — BP 136/81 | HR 105 | Ht 65.0 in | Wt 127.6 lb

## 2019-12-09 DIAGNOSIS — G43109 Migraine with aura, not intractable, without status migrainosus: Secondary | ICD-10-CM | POA: Diagnosis not present

## 2019-12-09 NOTE — Patient Instructions (Signed)
  1. Limit use of pain relievers to no more than 2 days out of the week.  These medications include acetaminophen, NSAIDs (ibuprofen/Advil/Motrin, naproxen/Aleve, triptans (Imitrex/sumatriptan), Excedrin, and narcotics.  This will help reduce risk of rebound headaches. 2. Be aware of common food triggers:  - Caffeine:  coffee, black tea, cola, Mt. Dew  - Chocolate  - Dairy:  aged cheeses (brie, blue, cheddar, gouda, [place], provolone, [place], Swiss, etc), chocolate milk, buttermilk, sour cream, limit eggs and yogurt  - Nuts, peanut butter  - Alcohol  - Cereals/grains:  FRESH breads (fresh bagels, sourdough, doughnuts), yeast productions  - Processed/canned/aged/cured meats (pre-packaged deli meats, hotdogs)  - MSG/glutamate:  soy sauce, flavor enhancer, pickled/preserved/marinated foods  - Sweeteners:  aspartame (Equal, Nutrasweet).  Sugar and Splenda are okay  - Vegetables:  legumes (lima beans, lentils, snow peas, fava beans, pinto peans, peas, garbanzo beans), sauerkraut, onions, olives, pickles  - Fruit:  avocados, bananas, citrus fruit (orange, lemon, grapefruit), mango  - Other:  Frozen meals, macaroni and cheese 3. Routine exercise 4. Stay adequately hydrated (aim for 64 oz water daily) 5. Keep headache diary 6. Maintain proper stress management 7. Maintain proper sleep hygiene 8. Do not skip meals 9. Consider supplements:  magnesium citrate 400mg  daily, riboflavin 400mg  daily, coenzyme Q10 100mg  three times daily.

## 2020-01-21 ENCOUNTER — Encounter: Payer: BC Managed Care – PPO | Admitting: Family Medicine

## 2020-02-09 ENCOUNTER — Encounter: Payer: BC Managed Care – PPO | Admitting: Physician Assistant

## 2020-02-09 ENCOUNTER — Telehealth: Payer: Self-pay | Admitting: Family Medicine

## 2020-02-09 NOTE — Telephone Encounter (Signed)
Pt's response is unacceptable.  We won't tolerate yelling or bad behavior.  If this happens again, she will need to find another office

## 2020-02-09 NOTE — Telephone Encounter (Signed)
Patient returned my phone call concerning rescheduling her appointment.  Patient became hostile and yelled at me repeatedly.  I tried to apologize to her and schedule her appointment with [facility] for the same day which made her irate.  She insisted on having the office manager call her whenever she "decided to come back in".   I called the other patient and rescheduled his appointment for another date.  He was fine with that.  I called patient back and told her that I had it worked out that she would still have her appointment with Dr. Beverely Low and she made the statement that it was about time I did my job.

## 2020-02-10 ENCOUNTER — Ambulatory Visit (INDEPENDENT_AMBULATORY_CARE_PROVIDER_SITE_OTHER): Payer: BC Managed Care – PPO | Admitting: Family Medicine

## 2020-02-10 ENCOUNTER — Encounter: Payer: BC Managed Care – PPO | Admitting: Family Medicine

## 2020-02-10 ENCOUNTER — Encounter: Payer: Self-pay | Admitting: Family Medicine

## 2020-02-10 ENCOUNTER — Encounter: Payer: BC Managed Care – PPO | Admitting: Physician Assistant

## 2020-02-10 ENCOUNTER — Other Ambulatory Visit: Payer: Self-pay

## 2020-02-10 VITALS — BP 120/70 | HR 91 | Temp 99.0°F | Resp 17 | Ht 66.0 in | Wt 123.8 lb

## 2020-02-10 DIAGNOSIS — Z8639 Personal history of other endocrine, nutritional and metabolic disease: Secondary | ICD-10-CM

## 2020-02-10 DIAGNOSIS — Z0000 Encounter for general adult medical examination without abnormal findings: Secondary | ICD-10-CM

## 2020-02-10 DIAGNOSIS — R634 Abnormal weight loss: Secondary | ICD-10-CM | POA: Diagnosis not present

## 2020-02-10 DIAGNOSIS — Z1159 Encounter for screening for other viral diseases: Secondary | ICD-10-CM | POA: Diagnosis not present

## 2020-02-10 DIAGNOSIS — Z23 Encounter for immunization: Secondary | ICD-10-CM | POA: Diagnosis not present

## 2020-02-10 LAB — CBC WITH DIFFERENTIAL/PLATELET
Basophils Absolute: 0.1 10*3/uL (ref 0.0–0.1)
Basophils Relative: 1.1 % (ref 0.0–3.0)
Eosinophils Absolute: 0.3 10*3/uL (ref 0.0–0.7)
Eosinophils Relative: 4.5 % (ref 0.0–5.0)
HCT: 42 % (ref 36.0–46.0)
Hemoglobin: 14.5 g/dL (ref 12.0–15.0)
Lymphocytes Relative: 23.3 % (ref 12.0–46.0)
Lymphs Abs: 1.5 10*3/uL (ref 0.7–4.0)
MCHC: 34.4 g/dL (ref 30.0–36.0)
MCV: 97.6 fl (ref 78.0–100.0)
Monocytes Absolute: 0.4 10*3/uL (ref 0.1–1.0)
Monocytes Relative: 7 % (ref 3.0–12.0)
Neutro Abs: 4 10*3/uL (ref 1.4–7.7)
Neutrophils Relative %: 64.1 % (ref 43.0–77.0)
Platelets: 332 10*3/uL (ref 150.0–400.0)
RBC: 4.3 Mil/uL (ref 3.87–5.11)
RDW: 12.1 % (ref 11.5–15.5)
WBC: 6.2 10*3/uL (ref 4.0–10.5)

## 2020-02-10 LAB — HEPATIC FUNCTION PANEL
ALT: 14 U/L (ref 0–35)
AST: 17 U/L (ref 0–37)
Albumin: 4.8 g/dL (ref 3.5–5.2)
Alkaline Phosphatase: 48 U/L (ref 39–117)
Bilirubin, Direct: 0.2 mg/dL (ref 0.0–0.3)
Total Bilirubin: 1.3 mg/dL — ABNORMAL HIGH (ref 0.2–1.2)
Total Protein: 7.1 g/dL (ref 6.0–8.3)

## 2020-02-10 LAB — TSH: TSH: 1.06 u[IU]/mL (ref 0.35–4.50)

## 2020-02-10 LAB — LIPID PANEL
Cholesterol: 140 mg/dL (ref 0–200)
HDL: 65.4 mg/dL (ref >39.00)
LDL Cholesterol: 62 mg/dL (ref 0–99)
NonHDL: 74.81
Total CHOL/HDL Ratio: 2
Triglycerides: 62 mg/dL (ref 0.0–149.0)
VLDL: 12.4 mg/dL (ref 0.0–40.0)

## 2020-02-10 LAB — BASIC METABOLIC PANEL
BUN: 14 mg/dL (ref 6–23)
CO2: 27 mEq/L (ref 19–32)
Calcium: 9.5 mg/dL (ref 8.4–10.5)
Chloride: 104 mEq/L (ref 96–112)
Creatinine, Ser: 0.7 mg/dL (ref 0.40–1.20)
GFR: 114.81 mL/min (ref >60.00)
Glucose, Bld: 91 mg/dL (ref 70–99)
Potassium: 4 mEq/L (ref 3.5–5.1)
Sodium: 138 mEq/L (ref 135–145)

## 2020-02-10 MED ORDER — SERTRALINE HCL 50 MG PO TABS: 50.0000 mg | ORAL_TABLET | Freq: Every day | ORAL | 3 refills | Status: DC

## 2020-02-10 NOTE — Patient Instructions (Signed)
Follow up in 1 year or as needed We'll notify you of your lab results and make any changes if needed Keep up the good work!  You look great! Increase the Sertraline to 50mg  daily- 2 of what you have at home and 1 of the new prescription Call and schedule your GYN appt Call with any questions or concerns Stay Safe!  Stay Healthy! Happy New Year!!

## 2020-02-10 NOTE — Assessment & Plan Note (Signed)
Pt's PE WNL.  UTD on COVID, Tdap.  Flu shot today.  Pt to schedule GYN for pap.  Check labs.  Anticipatory guidance provided.

## 2020-02-10 NOTE — Progress Notes (Signed)
   Subjective:    Patient ID: Karen Booker, female    DOB: 27-Mar-1990, 29 y.o.   MRN: [id number]  HPI CPE- UTD on COVID (including booster), Tdap.  Due for flu shot today.  Reviewed past medical, surgical, family and social histories.   Patient Care Team    Relationship Specialty Notifications Start End  Sheliah Hatch, MD PCP - General Family Medicine  05/28/11    Comment: Wilhemena Durie, Madelaine Etienne, MD Consulting Physician Obstetrics and Gynecology  09/17/19     Health Maintenance  Topic Date Due  . Hepatitis C Screening  Never done  . INFLUENZA VACCINE  09/13/2019  . PAP SMEAR-Modifier  10/05/2020 (Originally 01/15/2019)  . TETANUS/TDAP  11/20/2021  . COVID-19 Vaccine  Completed  . HIV Screening  Completed      Review of Systems Patient reports no vision/ hearing changes, adenopathy,fever, persistant/recurrent hoarseness , swallowing issues, chest pain, palpitations, edema, persistant/recurrent cough, hemoptysis, dyspnea (rest/exertional/paroxysmal nocturnal), gastrointestinal bleeding (melena, rectal bleeding), abdominal pain, significant heartburn, bowel changes, GU symptoms (dysuria, hematuria, incontinence), Gyn symptoms (abnormal  bleeding, pain),  syncope, focal weakness, memory loss, numbness & tingling, skin/hair/nail changes, abnormal bruising or bleeding, anxiety, or depression.   + 5 lb weight loss  This visit occurred during the SARS-CoV-2 public health emergency.  Safety protocols were in place, including screening questions prior to the visit, additional usage of staff PPE, and extensive cleaning of exam room while observing appropriate contact time as indicated for disinfecting solutions.       Objective:   Physical Exam General Appearance:    Alert, cooperative, no distress, appears stated age  Head:    Normocephalic, without obvious abnormality, atraumatic  Eyes:    PERRL, conjunctiva/corneas clear, EOM's intact, fundi    benign, both eyes  Ears:     Normal TM's and external ear canals, both ears  Nose:   Deferred due to COVID  Throat:   Neck:   Supple, symmetrical, trachea midline, no adenopathy;    Thyroid: no enlargement/tenderness/nodules  Back:     Symmetric, no curvature, ROM normal, no CVA tenderness  Lungs:     Clear to auscultation bilaterally, respirations unlabored  Chest Wall:    No tenderness or deformity   Heart:    Regular rate and rhythm, S1 and S2 normal, no murmur, rub   or gallop  Breast Exam:    Deferred to GYN  Abdomen:     Soft, non-tender, bowel sounds active all four quadrants,    no masses, no organomegaly  Genitalia:    Deferred to GYN  Rectal:    Extremities:   Extremities normal, atraumatic, no cyanosis or edema  Pulses:   2+ and symmetric all extremities  Skin:   Skin color, texture, turgor normal, no rashes or lesions  Lymph nodes:   Cervical, supraclavicular, and axillary nodes normal  Neurologic:   CNII-XII intact, normal strength, sensation and reflexes    throughout          Assessment & Plan:

## 2020-02-11 LAB — HEPATITIS C ANTIBODY
Hepatitis C Ab: NONREACTIVE
SIGNAL TO CUT-OFF: 0.01 (ref <1.00)

## 2020-03-22 ENCOUNTER — Other Ambulatory Visit: Payer: Self-pay

## 2020-03-22 ENCOUNTER — Ambulatory Visit: Payer: BC Managed Care – PPO | Admitting: Family Medicine

## 2020-03-22 ENCOUNTER — Ambulatory Visit (INDEPENDENT_AMBULATORY_CARE_PROVIDER_SITE_OTHER): Payer: BC Managed Care – PPO

## 2020-03-22 ENCOUNTER — Ambulatory Visit (HOSPITAL_COMMUNITY): Payer: BC Managed Care – PPO

## 2020-03-22 VITALS — BP 120/76 | HR 91 | Ht 66.0 in | Wt 127.8 lb

## 2020-03-22 DIAGNOSIS — M533 Sacrococcygeal disorders, not elsewhere classified: Secondary | ICD-10-CM | POA: Diagnosis not present

## 2020-03-22 NOTE — Patient Instructions (Signed)
Thank you for coming in today.  Offload pressure.   Please use voltaren gel up to 4x daily for pain as needed.   Ok to use ibuprofen or tylenol or both.   Let me know if you want to start PT or pelvic PT.   Recheck as needed.   Coccydynia. Tailbone Injury  The tailbone (coccyx) is the small bone at the lower end of the spine. A tailbone injury may involve stretched ligaments, bruising, or a broken bone (fracture). Tailbone injuries can be painful, and some may take a long time to heal. What are the causes? This condition may be caused by:  Falling and landing on the tailbone.  Repeated strain or friction from sitting for long periods of time. This may include actions such as rowing and bicycling.  Childbirth. In some cases, the cause may not be known. What are the signs or symptoms? Symptoms of this condition include:  Pain in the tailbone area or lower back, especially when sitting.  Pain or difficulty when standing up from a sitting position.  Bruising or swelling in the tailbone area.  Painful bowel movements.  In women, pain during intercourse. How is this diagnosed? This condition may be diagnosed based on:  Your symptoms.  A physical exam. If your health care provider suspects a fracture, you may have additional tests, such as:  X-rays.  CT scan.  MRI. How is this treated? Most tailbone injuries heal on their own in 4-6 weeks. However, recovery time may be longer if the injury involves a fracture. Treatment for this condition may include:  NSAIDs or other over-the-counter medicines to help relieve your pain.  Using a large, rubber or inflated ring or cushion to take pressure off the tailbone when sitting.  Physical therapy.  Injecting the tailbone area with local anesthesia and steroid medicine. This is not normally needed unless the pain does not improve over time with over-the-counter pain medicines. Follow these instructions at  home: Activity  Avoid sitting for long periods of time.  To prevent repeating an injury that is caused by strain or friction: ? Wear appropriate padding and sports gear when bicycling and rowing.  Increase your activity as the pain allows. Perform any exercises that are recommended by your health care provider or physical therapist. Managing pain, stiffness, and swelling  To help decrease discomfort when sitting: ? Sit on your rubber or inflated ring or cushion as told by your health care provider. ? Lean forward when you sit.  If directed, apply ice to the injured area: ? Put ice in a plastic bag. ? Place a towel between your skin and the bag. ? Leave the ice on for 20 minutes, 2-3 times per day for the first 1-2 days.  If directed, apply heat to the affected area as often as told by your health care provider. Use the heat source that your health care provider recommends, such as a moist heat pack or a heating pad. ? Place a towel between your skin and the heat source. ? Leave the heat on for 20-30 minutes. ? Remove the heat if your skin turns bright red. This is especially important if you are unable to feel pain, heat, or cold. You may have a greater risk of getting burned. General instructions  Take over-the-counter and prescription medicines only as told by your health care provider.  To prevent or treat constipation or painful bowel movements, your health care provider may recommend that you: ? Drink enough fluid to  keep your urine pale yellow. ? Eat foods that are high in fiber, such as fresh fruits and vegetables, whole grains, and beans. ? Limit foods that are high in fat and processed sugars, such as fried and sweet foods. ? Take an over-the-counter or prescription medicine for constipation.  Keep all follow-up visits as directed by your health care provider. This is important. Contact a health care provider if:  Your pain becomes worse or is not controlled with  medicine.  Your bowel movements cause a great deal of discomfort.  You are unable to have a bowel movement after 4 days.  You have pain during intercourse. Summary  A tailbone injury may involve stretched ligaments, bruising, or a broken bone (fracture).  Tailbone injuries can be painful. Most heal on their own in 4-6 weeks.  Treatment may include taking NSAIDs, using a rubber or inflated ring or cushion when sitting, and physical therapy.  Follow any recommendations from your health care provider to prevent or treat constipation. This information is not intended to replace advice given to you by your health care provider. Make sure you discuss any questions you have with your health care provider. Document Revised: 02/26/2017 Document Reviewed: 02/26/2017 Elsevier Patient Education  2021 [employer].

## 2020-03-22 NOTE — Progress Notes (Signed)
    Subjective:    CC: Sacral/coccyx pain  I, Wendy Poet, LAT, ATC, am serving as scribe for Dr. Lynne Leader.  HPI: Pt is a 30 y/o female presenting w/ sacral and coccyx pain after falling and landing on buttock/tailbone and falling the rest of the way down the stairs, this morning, 2/8. She specifically locates her pain to across sacrum and coccyx. Pt thinks she has hx tailbone fx; once during labor and falling as a child.  Radiating pain: no Aggravating factors: movement, sitting, transitoning Treatments tried: IBU, ice  Pertinent review of Systems: No fevers or chills  Relevant historical information: Migraines.  History of prior coccydynia with childbirth   Objective:    Vitals:   03/22/20 1301  BP: 120/76  Pulse: 91  SpO2: 99%   General: Well Developed, well nourished, and in no acute distress.   MSK: L-spine normal-appearing nontender midline normal lumbar motion. Sacrum and coccyx normal-appearing tender palpation coccyx. Normal hip motion.  Extra strength is intact. Reflexes are intact distally bilateral lower extremities.  Lab and Radiology Results  X-ray images sacrum and coccyx obtained today personally independently interpreted No fractures visible. Await formal radiology review   Impression and Recommendations:    Assessment and Plan: 30 y.o. female with coccydynia after fall.  No fracture per my read however radiology overread is still pending.  Recommend offloading and relative rest.  Also recommend topical diclofenac gel and oral NSAIDs.  If not improving patient will notify me and will refer to pelvic PT.Marland Kitchen  PDMP not reviewed this encounter. Orders Placed This Encounter  Procedures  . DG Sacrum/Coccyx    Standing Status:   Future    Number of Occurrences:   1    Standing Expiration Date:   03/22/2021    Order Specific Question:   Reason for Exam (SYMPTOM  OR DIAGNOSIS REQUIRED)    Answer:   coccyx pain    Order Specific Question:   Is patient  pregnant?    Answer:   No    Order Specific Question:   Preferred imaging location?    Answer:   [facility]   No orders of the defined types were placed in this encounter.   Discussed warning signs or symptoms. Please see discharge instructions. Patient expresses understanding.   The above documentation has been reviewed and is accurate and complete Lynne Leader, M.D.

## 2020-03-23 NOTE — Progress Notes (Signed)
No fracture present in the sacrum or coccyx.

## 2020-03-23 NOTE — Progress Notes (Signed)
No fracture present in the sacrum or coccyx

## 2020-04-10 ENCOUNTER — Other Ambulatory Visit: Payer: Self-pay

## 2020-04-10 ENCOUNTER — Ambulatory Visit (INDEPENDENT_AMBULATORY_CARE_PROVIDER_SITE_OTHER): Payer: BC Managed Care – PPO

## 2020-04-10 ENCOUNTER — Ambulatory Visit (HOSPITAL_COMMUNITY)
Admission: EM | Admit: 2020-04-10 | Discharge: 2020-04-10 | Disposition: A | Discharge status: Home / Self Care | Payer: BC Managed Care – PPO | Attending: Internal Medicine | Admitting: Internal Medicine

## 2020-04-10 ENCOUNTER — Encounter (HOSPITAL_COMMUNITY): Payer: Self-pay | Admitting: *Deleted

## 2020-04-10 DIAGNOSIS — M25461 Effusion, right knee: Secondary | ICD-10-CM | POA: Diagnosis not present

## 2020-04-10 DIAGNOSIS — M25561 Pain in right knee: Secondary | ICD-10-CM | POA: Diagnosis not present

## 2020-04-10 DIAGNOSIS — M79661 Pain in right lower leg: Secondary | ICD-10-CM

## 2020-04-10 MED ORDER — IBUPROFEN 600 MG PO TABS: 600.0000 mg | ORAL_TABLET | Freq: Four times a day (QID) | ORAL | 0 refills | Status: DC | PRN

## 2020-04-10 NOTE — ED Provider Notes (Signed)
[facility]    CSN: [id number] Arrival date & time: 04/10/20  1725      History   Chief Complaint Chief Complaint  Patient presents with  . Knee Pain    HPI Karen Booker is a 30 y.o. female.  Patient presents with pain, swelling, bruising of right knee x3 weeks.  She reports increased swelling today accompanied by localized numbness and tingling.  No falls or known injury.  No difficulty with ambulation or weightbearing.  She states she is down on her knees a lot because she has a young child.  Patient denies fever, chills, open wounds, or other symptoms.  She denies numbness, weakness, paresthesias in her foot or lower leg.  Her medical history includes IBS, scoliosis, depression, anxiety, migraine headaches.  The history is provided by the patient and medical records.    Past Medical History:  Diagnosis Date  . Anxiety   . Depression   . IBS (irritable bowel syndrome)   . Irritable bowel syndrome   . Neuromuscular disorder ([place])   . Other preterm infants, unspecified (weight)(765.10)   . Scoliosis (and kyphoscoliosis), idiopathic   . Urinary tract infection, site not specified   . UTI (urinary tract infection)     Patient Active Problem List   Diagnosis Date Noted  . Migraine with aura and without status migrainosus, not intractable 09/17/2019  . Encounter for procreative genetic counseling 01/06/2016  . Depression with anxiety 10/22/2012  . 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 General medical examination 12/27/2010  . Allergic dermatitis 11/15/2010  . IBS 08/02/2008  . SCOLIOSIS 06/11/2006    Past Surgical History:  Procedure Laterality Date  . WISDOM TOOTH EXTRACTION  2010    OB History    Gravida  2   Para  1   Term  1   Preterm      AB  1   Living  1     SAB  1   IAB      Ectopic      Multiple  0   Live Births  1            Home Medications    Prior to Admission medications   Medication Sig Start Date End  Date Taking? Authorizing Provider  ibuprofen (ADVIL) 600 MG tablet Take 1 tablet (600 mg total) by mouth every 6 (six) hours as needed. 04/10/20  Yes Sharion Balloon, NP  acetaminophen (TYLENOL) 500 MG tablet Take 500 mg by mouth every 6 (six) hours as needed for mild pain or headache.    [provider]  ALPRAZolam (XANAX) 0.25 MG tablet alprazolam 0.25 mg tablet    [provider]  HEATHER 0.35 MG tablet Take 1 tablet by mouth daily. 10/01/19   [provider]  LAVENDER OIL PO Take 1 capsule by mouth daily.    [provider]  Omega-3 Fatty Acids (FISH OIL) 1000 MG CAPS Take 1 capsule by mouth daily.    [provider]  Prenatal Vit-Fe Fumarate-FA (PRENATAL MULTIVITAMIN) TABS tablet Take 1 tablet by mouth daily at 12 noon.    [provider]  sertraline (ZOLOFT) 50 MG tablet Take 1 tablet (50 mg total) by mouth daily. 02/10/20   Midge Minium, MD    Family History Family History  Problem Relation Age of Onset  . Mental illness Sister   . Hyperlipidemia Mother   . Alcohol abuse Father   . Arthritis Maternal Grandmother   .  Diabetes Maternal Grandmother   . Cancer Paternal Grandfather     Social History Social History   Tobacco Use  . Smoking status: Never Smoker  . Smokeless tobacco: Never Used  Vaping Use  . Vaping Use: Never used  Substance Use Topics  . Alcohol use: Yes    Comment: Socially  . Drug use: No     Allergies   Cephalexin, Nitrofurantoin, and Other   Review of Systems Review of Systems  Constitutional: Negative for chills and fever.  HENT: Negative for ear pain and sore throat.   Eyes: Negative for pain and visual disturbance.  Respiratory: Negative for cough and shortness of breath.   Cardiovascular: Negative for chest pain and palpitations.  Gastrointestinal: Negative for abdominal pain and vomiting.  Genitourinary: Negative for dysuria and hematuria.  Musculoskeletal: Positive for arthralgias  and joint swelling. Negative for gait problem.  Skin: Positive for color change. Negative for wound.  Neurological: Positive for numbness. Negative for syncope and weakness.  All other systems reviewed and are negative.    Physical Exam Triage Vital Signs ED Triage Vitals  Enc Vitals Group     BP      Pulse      Resp      Temp      Temp src      SpO2      Weight      Height      Head Circumference      Peak Flow      Pain Score      Pain Loc      Pain Edu?      Excl. in [place]?    No data found.  Updated Vital Signs BP 135/89 (BP Location: Right Arm)   Pulse 90   Temp 98.8 F (37.1 C) (Oral)   Resp 18   LMP 03/15/2020   SpO2 99%   Visual Acuity Right Eye Distance:   Left Eye Distance:   Bilateral Distance:    Right Eye Near:   Left Eye Near:    Bilateral Near:     Physical Exam Vitals and nursing note reviewed.  Constitutional:      General: She is not in acute distress.    Appearance: She is well-developed and well-nourished. She is not ill-appearing.  HENT:     Head: Normocephalic and atraumatic.     Mouth/Throat:     Mouth: Mucous membranes are moist.  Eyes:     Conjunctiva/sclera: Conjunctivae normal.  Cardiovascular:     Rate and Rhythm: Normal rate and regular rhythm.     Heart sounds: Normal heart sounds.  Pulmonary:     Effort: Pulmonary effort is normal. No respiratory distress.     Breath sounds: Normal breath sounds.  Abdominal:     Palpations: Abdomen is soft.     Tenderness: There is no abdominal tenderness.  Musculoskeletal:        General: Swelling and tenderness present. No edema. Normal range of motion.     Cervical back: Neck supple.       Legs:     Comments: Tenderness, ecchymosis, edema of right knee.  No open wounds or erythema.  FROM, sensation intact, strength 5/5.  Skin:    General: Skin is warm and dry.     Capillary Refill: Capillary refill takes less than 2 seconds.     Findings: Bruising present. No erythema, lesion or  rash.  Neurological:     General: No focal deficit present.  Mental Status: She is alert and oriented to person, place, and time.     Sensory: No sensory deficit.     Motor: No weakness.     Gait: Gait normal.  Psychiatric:        Mood and Affect: Mood and affect and mood normal.        Behavior: Behavior normal.      UC Treatments / Results  Labs (all labs ordered are listed, but only abnormal results are displayed) Labs Reviewed - No data to display  EKG   Radiology DG Knee Complete 4 Views Right  Result Date: 04/10/2020 CLINICAL DATA:  Pain EXAM: RIGHT KNEE - COMPLETE 4+ VIEW COMPARISON:  None. FINDINGS: No acute fracture or dislocation. Joint spaces and alignment are maintained. No area of erosion or osseous destruction. No unexpected radiopaque foreign body. No joint effusion. Soft tissue swelling overlying the superficial knee anteriorly. IMPRESSION: Soft tissue swelling overlying the superficial knee anteriorly. No acute osseous abnormality. Electronically Signed   By: Valentino Saxon MD   On: 04/10/2020 17:50    Procedures Procedures (including critical care time)  Medications Ordered in UC Medications - No data to display  Initial Impression / Assessment and Plan / UC Course  I have reviewed the triage vital signs and the nursing notes.  Pertinent labs & imaging results that were available during my care of the patient were reviewed by me and considered in my medical decision making (see chart for details).   Pain and swelling of right knee.  X-ray shows soft tissue swelling but no bony abnormality.  Treating with ibuprofen, rest, elevation, ice packs, knee sleeve.  Patient declines crutches.  Instructed her to follow-up with orthopedics.  She agrees to plan of care.   Final Clinical Impressions(s) / UC Diagnoses   Final diagnoses:  Pain and swelling of knee, right     Discharge Instructions     Take the ibuprofen as prescribed.  Rest and elevate your  knee.  Apply ice packs 2-3 times a day for up to 20 minutes each.  Wear the knee sleeve as needed for comfort.    Follow up with an orthopedist.       ED Prescriptions    Medication Sig Dispense Auth. Provider   ibuprofen (ADVIL) 600 MG tablet Take 1 tablet (600 mg total) by mouth every 6 (six) hours as needed. 30 tablet Sharion Balloon, NP     PDMP not reviewed this encounter.   Sharion Balloon, NP 04/10/20 1807

## 2020-04-10 NOTE — ED Triage Notes (Signed)
Pt reports knee pain and swelling to RT knee. Bruising also observed on RT knee.

## 2020-04-10 NOTE — Discharge Instructions (Signed)
Take the ibuprofen as prescribed.  Rest and elevate your knee.  Apply ice packs 2-3 times a day for up to 20 minutes each.  Wear the knee sleeve as needed for comfort.    Follow up with an orthopedist.

## 2020-05-23 ENCOUNTER — Encounter: Payer: Self-pay | Admitting: Family Medicine

## 2020-08-10 ENCOUNTER — Encounter: Payer: Self-pay | Admitting: *Deleted

## 2021-01-11 ENCOUNTER — Encounter: Payer: Self-pay | Admitting: Family Medicine

## 2021-01-11 ENCOUNTER — Telehealth: Payer: BC Managed Care – PPO | Admitting: Family Medicine

## 2021-01-11 VITALS — Ht 65.0 in | Wt 128.0 lb

## 2021-01-11 DIAGNOSIS — K529 Noninfective gastroenteritis and colitis, unspecified: Secondary | ICD-10-CM

## 2021-01-11 MED ORDER — ONDANSETRON HCL 4 MG PO TABS: 4.0000 mg | ORAL_TABLET | Freq: Three times a day (TID) | ORAL | 0 refills | Status: DC | PRN

## 2021-01-11 NOTE — Progress Notes (Signed)
   Virtual Visit via Video   I connected with patient on 01/11/21 at 11:30 AM EST by a video enabled telemedicine application and verified that I am speaking with the correct person using two identifiers.  Location patient: Home Location provider: [facility] Office Persons participating in the virtual visit: Patient, Provider, [employer] Bill[employer] C)  I discussed the limitations of evaluation and management by telemedicine and the availability of in person appointments. The patient expressed understanding and agreed to proceed.  Subjective:   HPI:   N/V/D- sxs started Saturday w/ nausea, vomiting, and diarrhea.  Ongoing nausea.  Last episode of diarrhea this AM.  No fevers.  + sick contacts.    ROS:   See pertinent positives and negatives per HPI.  Patient Active Problem List   Diagnosis Date Noted   Migraine with aura and without status migrainosus, not intractable 09/17/2019   Encounter for procreative genetic counseling 01/06/2016   Depression with anxiety 10/22/2012   Herpes simplex type 1 infection 05/28/2011   General medical examination 12/27/2010   Allergic dermatitis 11/15/2010   IBS 08/02/2008   SCOLIOSIS 06/11/2006    Social History   Tobacco Use   Smoking status: Never   Smokeless tobacco: Never  Substance Use Topics   Alcohol use: Yes    Comment: Socially    Current Outpatient Medications:    acetaminophen (TYLENOL) 500 MG tablet, Take 500 mg by mouth every 6 (six) hours as needed for mild pain or headache., Disp: , Rfl:    ALPRAZolam (XANAX) 0.25 MG tablet, alprazolam 0.25 mg tablet, Disp: , Rfl:    ibuprofen (ADVIL) 600 MG tablet, Take 1 tablet (600 mg total) by mouth every 6 (six) hours as needed., Disp: 30 tablet, Rfl: 0   LAVENDER OIL PO, Take 1 capsule by mouth daily., Disp: , Rfl:    Omega-3 Fatty Acids (FISH OIL) 1000 MG CAPS, Take 1 capsule by mouth daily., Disp: , Rfl:    Prenatal Vit-Fe Fumarate-FA (PRENATAL MULTIVITAMIN) TABS tablet, Take 1  tablet by mouth daily at 12 noon., Disp: , Rfl:    sertraline (ZOLOFT) 50 MG tablet, Take 1 tablet (50 mg total) by mouth daily., Disp: 90 tablet, Rfl: 3  Allergies  Allergen Reactions   Cephalexin     REACTION: Rash all over just before finishing the course last time.   Nitrofurantoin     REACTION: rash and itching   Other Hives and Other (See Comments)    Tree nuts- hives in mouth    Objective:   Ht 5\' 5"  (1.651 m) Comment: pt reported  Wt 128 lb (58.1 kg) Comment: pt reported  BMI 21.30 kg/m  AAOx3, NAD NCAT, EOMI No obvious CN deficits Coloring WNL Pt is able to speak clearly, coherently without shortness of breath or increased work of breathing.  Thought process is linear.  Mood is appropriate.   Assessment and Plan:   Viral gastroenteritis- new.  Pt's sxs and recent sick contact are consistent w/ dx.  Start Zofran PRN.  Encouraged increased fluids, plenty of rest.  Pt to let me know if things aren't improving.  Pt expressed understanding and is in agreement w/ plan.    Annye Asa, MD 01/11/2021

## 2021-01-12 DIAGNOSIS — L309 Dermatitis, unspecified: Secondary | ICD-10-CM | POA: Insufficient documentation

## 2021-02-05 ENCOUNTER — Other Ambulatory Visit: Payer: Self-pay | Admitting: Family Medicine

## 2021-02-06 NOTE — Telephone Encounter (Signed)
Patient is requesting a refill of the following medications: Requested Prescriptions   Pending Prescriptions Disp Refills   sertraline (ZOLOFT) 50 MG tablet [Pharmacy Med Name: SERTRALINE HCL 50 MG TABLET] 90 tablet 3    Sig: TAKE 1 TABLET BY MOUTH DAILY    Date of patient request: 02/05/2021 Last office visit: 02/10/2020 Date of last refill: 02/10/2020 Last refill amount: 90 tablets 3 refills Follow up time period per chart: 02/10/2021

## 2021-02-10 ENCOUNTER — Encounter: Payer: BC Managed Care – PPO | Admitting: Family Medicine

## 2021-03-06 ENCOUNTER — Ambulatory Visit (INDEPENDENT_AMBULATORY_CARE_PROVIDER_SITE_OTHER): Payer: BC Managed Care – PPO | Admitting: Family Medicine

## 2021-03-06 ENCOUNTER — Encounter: Payer: Self-pay | Admitting: Family Medicine

## 2021-03-06 VITALS — BP 108/70 | HR 92 | Temp 98.8°F | Resp 22 | Ht 65.75 in | Wt 128.0 lb

## 2021-03-06 DIAGNOSIS — Z0000 Encounter for general adult medical examination without abnormal findings: Secondary | ICD-10-CM | POA: Diagnosis not present

## 2021-03-06 MED ORDER — SERTRALINE HCL 100 MG PO TABS: 100.0000 mg | ORAL_TABLET | Freq: Every day | ORAL | 3 refills | Status: DC

## 2021-03-06 NOTE — Patient Instructions (Signed)
Follow up in 1 year or as needed No need for labs todayPhebe Booker!! Keep up the good work on healthy diet and regular exercise- you look amazing!!! Call with any questions or concerns Stay Safe!  Stay Healthy!! Happy New Year!!!

## 2021-03-06 NOTE — Progress Notes (Signed)
° °  Subjective:    Patient ID: Laveda Abbe, female    DOB: January 05, 1991, 31 y.o.   MRN: [id number]  HPI CPE- UTD on Tdap, flu, COVID.  Pap upcoming in March  Patient Care Team    Relationship Specialty Notifications Start End  Midge Minium, MD PCP - General Family Medicine  05/28/11    Comment: Ina Kick, Colin Benton, MD Consulting Physician Obstetrics and Gynecology  09/17/19     Health Maintenance  Topic Date Due   PAP SMEAR-Modifier  05/13/2021 (Originally 01/15/2019)   TETANUS/TDAP  11/20/2021   INFLUENZA VACCINE  Completed   COVID-19 Vaccine  Completed   Hepatitis C Screening  Completed   HIV Screening  Completed   HPV VACCINES  Aged Out      Review of Systems Patient reports no vision/ hearing changes, adenopathy,fever, weight change,  persistant/recurrent hoarseness , swallowing issues, chest pain, palpitations, edema, persistant/recurrent cough, hemoptysis, dyspnea (rest/exertional/paroxysmal nocturnal), gastrointestinal bleeding (melena, rectal bleeding), abdominal pain, significant heartburn, bowel changes, GU symptoms (dysuria, hematuria, incontinence), Gyn symptoms (abnormal  bleeding, pain),  syncope, focal weakness, memory loss, numbness & tingling, skin/hair/nail changes, abnormal bruising or bleeding, anxiety, or depression.   This visit occurred during the SARS-CoV-2 public health emergency.  Safety protocols were in place, including screening questions prior to the visit, additional usage of staff PPE, and extensive cleaning of exam room while observing appropriate contact time as indicated for disinfecting solutions.      Objective:   Physical Exam General Appearance:    Alert, cooperative, no distress, appears stated age  Head:    Normocephalic, without obvious abnormality, atraumatic  Eyes:    PERRL, conjunctiva/corneas clear, EOM's intact, fundi    benign, both eyes  Ears:    Normal TM's and external ear canals, both ears  Nose:   Deferred due to  COVID  Throat:   Neck:   Supple, symmetrical, trachea midline, no adenopathy;    Thyroid: no enlargement/tenderness/nodules  Back:     Symmetric, no curvature, ROM normal, no CVA tenderness  Lungs:     Clear to auscultation bilaterally, respirations unlabored  Chest Wall:    No tenderness or deformity   Heart:    Regular rate and rhythm, S1 and S2 normal, no murmur, rub   or gallop  Breast Exam:    Deferred to GYN  Abdomen:     Soft, non-tender, bowel sounds active all four quadrants,    no masses, no organomegaly  Genitalia:    Deferred to GYN  Rectal:    Extremities:   Extremities normal, atraumatic, no cyanosis or edema  Pulses:   2+ and symmetric all extremities  Skin:   Skin color, texture, turgor normal, no rashes or lesions  Lymph nodes:   Cervical, supraclavicular, and axillary nodes normal  Neurologic:   CNII-XII intact, normal strength, sensation and reflexes    throughout          Assessment & Plan:

## 2021-03-06 NOTE — Assessment & Plan Note (Signed)
Pt's PE WNL.  UTD on immunizations.  GYN exam scheduled for March.  No need for labs today due to lack of risk factors and last year's were all WNL.  Anticipatory guidance provided.

## 2021-05-05 LAB — HM PAP SMEAR

## 2021-07-06 ENCOUNTER — Ambulatory Visit: Payer: BC Managed Care – PPO | Admitting: Family Medicine

## 2021-07-06 ENCOUNTER — Encounter: Payer: Self-pay | Admitting: Family Medicine

## 2021-07-06 VITALS — BP 122/80 | HR 109 | Temp 98.6°F | Resp 17 | Ht 65.7 in | Wt 130.0 lb

## 2021-07-06 DIAGNOSIS — H1033 Unspecified acute conjunctivitis, bilateral: Secondary | ICD-10-CM | POA: Diagnosis not present

## 2021-07-06 MED ORDER — POLYMYXIN B-TRIMETHOPRIM 10000-0.1 UNIT/ML-% OP SOLN: 2.0000 [drp] | Freq: Three times a day (TID) | OPHTHALMIC | 0 refills | Status: DC

## 2021-07-06 NOTE — Patient Instructions (Signed)
Follow up as needed or as scheduled START the eye drops as directed Warm compresses to the eyes to help w/ the goo and the matting Call with any questions or concerns Stay Safe!  Stay Healthy! ENJOY YOUR TRIP!!!

## 2021-07-06 NOTE — Progress Notes (Signed)
   Subjective:    Patient ID: Karen Booker, female    DOB: Aug 16, 1990, 31 y.o.   MRN: [id number]  HPI Conjunctivitis- pt states she has had sxs x1 week.  Started w/ L eye redness, swelling, matted shut.  Went to UC on Friday and was given erythromycin ointment.  Checked for corneal abrasion- negative.  Thought things were improving but then returned w/ a vengeance on Tuesday.  Both eyes are now red, itching, burning, crusted, gritty.  Also had nasal congestion, mild sore throat, lymphadenopathy.  No sick contacts at home.   Review of Systems For ROS see HPI     Objective:   Physical Exam Vitals reviewed.  Constitutional:      General: She is not in acute distress.    Appearance: She is not ill-appearing.  HENT:     Head: Normocephalic and atraumatic.  Eyes:     General:        Right eye: Discharge present.        Left eye: Discharge present.    Extraocular Movements: Extraocular movements intact.     Pupils: Pupils are equal, round, and reactive to light.     Comments: Bilateral conjunctival injxn w/ limbic sparing  Neurological:     Mental Status: She is alert.          Assessment & Plan:  Conjunctivitis- new.  Bilateral.  Pt's sxs were improving w/ erythromycin ointment but then flared again on Tuesday.  Pt has small child at home and is set to travel next weekend.  Will switch to Polytrim eye drops.  Reviewed supportive care and red flags that should prompt return.  Pt expressed understanding and is in agreement w/ plan.

## 2021-08-28 ENCOUNTER — Encounter: Payer: Self-pay | Admitting: Family Medicine

## 2021-11-24 NOTE — Progress Notes (Unsigned)
NEUROLOGY FOLLOW UP OFFICE NOTE  Karen Booker [id number]  Assessment/Plan:   Migraine without aura, without status migrainosus, intractable - unclear why severe now.  Neurologic exam is normal.  Given that this is not been established as an ongoing chronic problem and exam unremarkable, would hold off on imaging.   Migraine with aura, without status migrainosus, not intractable, stable  Migraine prevention:  Defer for now.  If she continues to have more frequent migraines, will consider starting topiramate Migraine rescue:  she will try sumatriptan '100mg'$  with Zofran Limit use of pain relievers to no more than 2 days out of week to prevent risk of rebound or medication-overuse headache. Keep headache diary Follow up 3-4 months   Subjective:  Karen Booker is a 31 year old right-handed female with IBS, anxiety and depression who follows up for migraines.  UPDATE: Last seen in October 2021.  Migraines improved after being switched from estrogen to progesterone-only pill.  No migraine with aura since.    She has history of migraines without aura for the past 5-7 years.  They are severe throbbing pain beginning in back of neck/head wrapping around in bandlike distribution.  Associated nausea, photophobia, phonophobia and osmophobia and rarely vomiting.  Occur mid-day.  Usually last a couple of hours with Tylenol and a nap and occure very 2 to 3 months.  Over the past year, they have occurred in February, April, May and August.  However, she has had two in the past 2 weeks, more severe and lasting 2-3 days.  She tried Sudafed which helped.  She tried [place] '50mg'$  which only took the edge off.  Unsure why they have been more severe.     Current NSAIDS/analgesics:  Tylenol Current triptans:  none Current ergotamine:  none Current anti-emetic:  none Current muscle relaxants:  none Current Antihypertensive medications:  none Current Antidepressant medications:  Sertraline '25mg'$   daily Current Anticonvulsant medications:  none Current anti-CGRP:  none Current Vitamins/Herbal/Supplements:  Fish oil, prenatal MVI Current Antihistamines/Decongestants:  none Other therapy:  none Hormone/birth control:  Slynd  HISTORY: She has had migraines since age 13.  They have increased in frequency since this past Spring around the time she started lo-loestrin after weaning her baby off of breastfeeding.  She first experiences a visual aura described as a crescent shape in her right eye that enlarges and moves out of her visual field lasting 10 to 30 minutes followed by a mild primarily right-sided pressure headache.  Headache lasts 1 to 2 hours.  Initially occurred about once a year to once every few years.  Then, they were occurring once a month.  Unsure if it correlated with her period because she has frequent breakthrough bleeding. There is associated nausea and fatigue.  There is no associated photophobia, phonophobia, numbness or weakness.  Unaware of any specific trigger.  She would also have some headaches without aura about once a week.      Past NSAIDS/analgesics:  none Past abortive triptans:  none Past abortive ergotamine:  none Past muscle relaxants:  none Past anti-emetic:  none Past antihypertensive medications:  none Past antidepressant medications:  none Past anticonvulsant medications:  none Past anti-CGRP:  none Past vitamins/Herbal/Supplements:  none Past antihistamines/decongestants:  none Other past therapies:  none  Caffeine:  2 cups coffee daily, 1 to 2 times a week a diet Coke or tea Diet:  40 oz water daily.  Skips lunch. Exercise:  No routine Depression:  no; Anxiety:  Yes.  Controlled on sertraline Other pain:  no Sleep hygiene:  good Family history of headache:  Mom (migriane), twin sister (migraine with aura)  PAST MEDICAL HISTORY: Past Medical History:  Diagnosis Date   Anxiety    Depression    IBS (irritable bowel syndrome)    Irritable  bowel syndrome    Neuromuscular disorder ([place])    Other preterm infants, unspecified (weight)(765.10)    Scoliosis (and kyphoscoliosis), idiopathic    Urinary tract infection, site not specified    UTI (urinary tract infection)     MEDICATIONS: Current Outpatient Medications on File Prior to Visit  Medication Sig Dispense Refill   Drospirenone (SLYND PO)      LAVENDER OIL PO Take 1 capsule by mouth daily.     Multiple Vitamin (MULTIVITAMIN) tablet Take 1 tablet by mouth daily.     Omega-3 Fatty Acids (FISH OIL) 1000 MG CAPS Take 1 capsule by mouth daily.     sertraline (ZOLOFT) 100 MG tablet Take 1 tablet (100 mg total) by mouth daily. 90 tablet 3   trimethoprim-polymyxin b (POLYTRIM) ophthalmic solution Place 2 drops into both eyes 3 (three) times daily. Use no longer than 7 days 10 mL 0   No current facility-administered medications on file prior to visit.    ALLERGIES: Allergies  Allergen Reactions   Cephalexin     REACTION: Rash all over just before finishing the course last time.   Nitrofurantoin     REACTION: rash and itching   Other Hives and Other (See Comments)    Tree nuts- hives in mouth    FAMILY HISTORY: Family History  Problem Relation Age of Onset   Mental illness Sister    Hyperlipidemia Mother    Alcohol abuse Father    Arthritis Maternal Grandmother    Diabetes Maternal Grandmother    Cancer Paternal Grandfather       Objective:  Blood pressure 125/82, pulse (!) 107, height '5\' 5"'$  (1.651 m), weight 132 lb (59.9 kg), SpO2 100 %. General: No acute distress.  Patient appears well-groomed.   Head:  Normocephalic/atraumatic Eyes:  Fundi examined but not visualized Neck: supple, no paraspinal tenderness, full range of motion Heart:  Regular rate and rhythm Neurological Exam: alert and oriented to person, place, and time.  Speech fluent and not dysarthric, language intact.  CN II-XII intact. Bulk and tone normal, muscle strength 5/5 throughout.  Sensation  to light touch intact.  Deep tendon reflexes 2+ throughout, toes downgoing.  Finger to nose testing intact.  Gait normal, Romberg negative.   Metta Clines, DO  CC: Annye Asa, MD

## 2021-11-27 ENCOUNTER — Ambulatory Visit: Payer: BC Managed Care – PPO | Admitting: Neurology

## 2021-11-27 ENCOUNTER — Encounter: Payer: Self-pay | Admitting: Neurology

## 2021-11-27 VITALS — BP 125/82 | HR 107 | Ht 65.0 in | Wt 132.0 lb

## 2021-11-27 DIAGNOSIS — G43109 Migraine with aura, not intractable, without status migrainosus: Secondary | ICD-10-CM

## 2021-11-27 DIAGNOSIS — G43019 Migraine without aura, intractable, without status migrainosus: Secondary | ICD-10-CM | POA: Diagnosis not present

## 2021-11-27 MED ORDER — SUMATRIPTAN SUCCINATE 100 MG PO TABS: 100.0000 mg | ORAL_TABLET | ORAL | 0 refills | Status: DC | PRN

## 2021-11-27 NOTE — Patient Instructions (Addendum)
  Take sumatriptan '100mg'$  at earliest onset of headache.  May repeat dose once in 2 hours if needed.  Maximum 2 tablets in 24 hours. Take ondansetron for nausea Limit use of pain relievers to no more than 2 days out of the week.  These medications include acetaminophen, NSAIDs (ibuprofen/Advil/Motrin, naproxen/Aleve, triptans (Imitrex/sumatriptan), Excedrin, and narcotics.  This will help reduce risk of rebound headaches. Be aware of common food triggers:  - Caffeine:  coffee, black tea, cola, Mt. Dew  - Chocolate  - Dairy:  aged cheeses (brie, blue, cheddar, gouda, Annabella, provolone, Satilla, Swiss, etc), chocolate milk, buttermilk, sour cream, limit eggs and yogurt  - Nuts, peanut butter  - Alcohol  - Cereals/grains:  FRESH breads (fresh bagels, sourdough, doughnuts), yeast productions  - Processed/canned/aged/cured meats (pre-packaged deli meats, hotdogs)  - MSG/glutamate:  soy sauce, flavor enhancer, pickled/preserved/marinated foods  - Sweeteners:  aspartame (Equal, Nutrasweet).  Sugar and Splenda are okay  - Vegetables:  legumes (lima beans, lentils, snow peas, fava beans, pinto peans, peas, garbanzo beans), sauerkraut, onions, olives, pickles  - Fruit:  avocados, bananas, citrus fruit (orange, lemon, grapefruit), mango  - Other:  Frozen meals, macaroni and cheese Routine exercise Stay adequately hydrated (aim for 64 oz water daily) Keep headache diary Maintain proper stress management Maintain proper sleep hygiene Do not skip meals Consider supplements:  magnesium citrate '400mg'$  daily, riboflavin '400mg'$  daily, coenzyme Q10 '100mg'$  three times daily. Follow up 3-4 months.  If migraine frequency increases, let me know and we can start a preventative.

## 2022-02-02 ENCOUNTER — Encounter: Payer: Self-pay | Admitting: Family Medicine

## 2022-02-08 ENCOUNTER — Other Ambulatory Visit: Payer: Self-pay

## 2022-02-08 DIAGNOSIS — F418 Other specified anxiety disorders: Secondary | ICD-10-CM

## 2022-02-08 MED ORDER — SERTRALINE HCL 50 MG PO TABS: 50.0000 mg | ORAL_TABLET | Freq: Every day | ORAL | 1 refills | Status: DC

## 2022-03-07 ENCOUNTER — Encounter: Payer: Self-pay | Admitting: Family Medicine

## 2022-03-07 ENCOUNTER — Ambulatory Visit (INDEPENDENT_AMBULATORY_CARE_PROVIDER_SITE_OTHER): Payer: BC Managed Care – PPO | Admitting: Family Medicine

## 2022-03-07 VITALS — BP 108/78 | HR 101 | Temp 98.4°F | Resp 17 | Ht 65.0 in | Wt 129.4 lb

## 2022-03-07 DIAGNOSIS — Z0000 Encounter for general adult medical examination without abnormal findings: Secondary | ICD-10-CM | POA: Diagnosis not present

## 2022-03-07 DIAGNOSIS — Z13220 Encounter for screening for lipoid disorders: Secondary | ICD-10-CM

## 2022-03-07 DIAGNOSIS — L509 Urticaria, unspecified: Secondary | ICD-10-CM | POA: Diagnosis not present

## 2022-03-07 LAB — CBC WITH DIFFERENTIAL/PLATELET
Basophils Absolute: 0 10*3/uL (ref 0.0–0.1)
Basophils Relative: 1.1 % (ref 0.0–3.0)
Eosinophils Absolute: 0.2 10*3/uL (ref 0.0–0.7)
Eosinophils Relative: 5.3 % — ABNORMAL HIGH (ref 0.0–5.0)
HCT: 42.5 % (ref 36.0–46.0)
Hemoglobin: 14.7 g/dL (ref 12.0–15.0)
Lymphocytes Relative: 43.8 % (ref 12.0–46.0)
Lymphs Abs: 1.9 10*3/uL (ref 0.7–4.0)
MCHC: 34.6 g/dL (ref 30.0–36.0)
MCV: 95.8 fl (ref 78.0–100.0)
Monocytes Absolute: 0.3 10*3/uL (ref 0.1–1.0)
Monocytes Relative: 7.9 % (ref 3.0–12.0)
Neutro Abs: 1.8 10*3/uL (ref 1.4–7.7)
Neutrophils Relative %: 41.9 % — ABNORMAL LOW (ref 43.0–77.0)
Platelets: 344 10*3/uL (ref 150.0–400.0)
RBC: 4.43 Mil/uL (ref 3.87–5.11)
RDW: 11.9 % (ref 11.5–15.5)
WBC: 4.3 10*3/uL (ref 4.0–10.5)

## 2022-03-07 LAB — HEPATIC FUNCTION PANEL
ALT: 19 U/L (ref 0–35)
AST: 20 U/L (ref 0–37)
Albumin: 4.8 g/dL (ref 3.5–5.2)
Alkaline Phosphatase: 50 U/L (ref 39–117)
Bilirubin, Direct: 0.1 mg/dL (ref 0.0–0.3)
Total Bilirubin: 0.8 mg/dL (ref 0.2–1.2)
Total Protein: 7.7 g/dL (ref 6.0–8.3)

## 2022-03-07 LAB — LIPID PANEL
Cholesterol: 134 mg/dL (ref 0–200)
HDL: 56.4 mg/dL (ref >39.00)
LDL Cholesterol: 65 mg/dL (ref 0–99)
NonHDL: 77.5
Total CHOL/HDL Ratio: 2
Triglycerides: 62 mg/dL (ref 0.0–149.0)
VLDL: 12.4 mg/dL (ref 0.0–40.0)

## 2022-03-07 LAB — BASIC METABOLIC PANEL
BUN: 14 mg/dL (ref 6–23)
CO2: 28 mEq/L (ref 19–32)
Calcium: 9.8 mg/dL (ref 8.4–10.5)
Chloride: 103 mEq/L (ref 96–112)
Creatinine, Ser: 0.75 mg/dL (ref 0.40–1.20)
GFR: 104.16 mL/min (ref >60.00)
Glucose, Bld: 95 mg/dL (ref 70–99)
Potassium: 4.4 mEq/L (ref 3.5–5.1)
Sodium: 139 mEq/L (ref 135–145)

## 2022-03-07 LAB — TSH: TSH: 0.82 u[IU]/mL (ref 0.35–5.50)

## 2022-03-07 NOTE — Assessment & Plan Note (Signed)
Pt's PE WNL.  UTD on pap, Tdap, flu.  She has not had labs done in a few years and is interested in doing them today.  Anticipatory guidance provided.

## 2022-03-07 NOTE — Patient Instructions (Signed)
Follow up in 1 year or as needed We'll notify you of your lab results and make any changes if needed Keep up the good work!  You look great! Call with any questions or concerns Stay Safe!  Stay Healthy! Happy Belated Birthday!!!

## 2022-03-07 NOTE — Progress Notes (Signed)
   Subjective:    Patient ID: Karen Booker, female    DOB: April 07, 1990, 32 y.o.   MRN: [id number]  HPI CPE- UTD on pap, Tdap, flu.  Patient Care Team    Relationship Specialty Notifications Start End  Midge Minium, MD PCP - General Family Medicine  05/28/11    Comment: Ina Kick, Colin Benton, MD Consulting Physician Obstetrics and Gynecology  09/17/19      Health Maintenance  Topic Date Due   PAP SMEAR-Modifier  05/05/2024   DTaP/Tdap/Td (2 - Td or Tdap) 07/23/2027   INFLUENZA VACCINE  Completed   COVID-19 Vaccine  Completed   Hepatitis C Screening  Completed   HIV Screening  Completed   HPV VACCINES  Aged Out      Review of Systems Patient reports no vision/ hearing changes, adenopathy,fever, weight change,  persistant/recurrent hoarseness , swallowing issues, chest pain, palpitations, edema, persistant/recurrent cough, hemoptysis, dyspnea (rest/exertional/paroxysmal nocturnal), gastrointestinal bleeding (melena, rectal bleeding), abdominal pain, significant heartburn, bowel changes, GU symptoms (dysuria, hematuria, incontinence), Gyn symptoms (abnormal  bleeding, pain),  syncope, focal weakness, memory loss, numbness & tingling, skin/hair/nail changes, abnormal bruising or bleeding, anxiety, or depression.     Objective:   Physical Exam General Appearance:    Alert, cooperative, no distress, appears stated age  Head:    Normocephalic, without obvious abnormality, atraumatic  Eyes:    PERRL, conjunctiva/corneas clear, EOM's intact both eyes  Ears:    Normal TM's and external ear canals, both ears  Nose:   Nares normal, septum midline, mucosa normal, no drainage    or sinus tenderness  Throat:   Lips, mucosa, and tongue normal; teeth and gums normal  Neck:   Supple, symmetrical, trachea midline, no adenopathy;    Thyroid: no enlargement/tenderness/nodules  Back:     Symmetric, no curvature, ROM normal, no CVA tenderness  Lungs:     Clear to auscultation bilaterally,  respirations unlabored  Chest Wall:    No tenderness or deformity   Heart:    Regular rate and rhythm, S1 and S2 normal, no murmur, rub   or gallop  Breast Exam:    Deferred to GYN  Abdomen:     Soft, non-tender, bowel sounds active all four quadrants,    no masses, no organomegaly  Genitalia:    Deferred to GYN  Rectal:    Extremities:   Extremities normal, atraumatic, no cyanosis or edema  Pulses:   2+ and symmetric all extremities  Skin:   Skin color, texture, turgor normal, no rashes or lesions  Lymph nodes:   Cervical, supraclavicular, and axillary nodes normal  Neurologic:   CNII-XII intact, normal strength, sensation and reflexes    throughout          Assessment & Plan:

## 2022-03-08 ENCOUNTER — Telehealth: Payer: Self-pay

## 2022-03-08 NOTE — Telephone Encounter (Signed)
-----  Message from Midge Minium, MD sent at 03/08/2022  7:37 AM EST ----- Labs look great!!  No changes at this time

## 2022-03-08 NOTE — Telephone Encounter (Signed)
Pt seen Results via  my chart  

## 2022-03-21 NOTE — Progress Notes (Unsigned)
New Patient Note  RE: [name] MRN: [id number] DOB: 07-31-1990 Date of Office Visit: 03/22/2022  Consult requested by: Midge Minium, MD Primary care provider: Midge Minium, MD  Chief Complaint: No chief complaint on file.  History of Present Illness: I had the pleasure of seeing Karen Booker for initial evaluation at the [facility] on 03/21/2022. She is a 32 y.o. female, who is referred here by Midge Minium, MD for the evaluation of hives.  Rash started about *** ago. Mainly occurs on her ***. Describes them as ***. Individual rashes lasts about ***. No ecchymosis upon resolution. Associated symptoms include: ***.  Frequency of episodes: ***. Suspected triggers are ***. Denies any *** fevers, chills, changes in medications, foods, personal care products or recent infections. She has tried the following therapies: *** with *** benefit. Systemic steroids ***. Currently on ***.  Previous work up includes: ***. Previous history of rash/hives: ***. Patient is up to date with the following cancer screening tests: ***.  Assessment and Plan: [name] is a 33 y.o. female with: No problem-specific Assessment & Plan notes found for this encounter.  No follow-ups on file.  No orders of the defined types were placed in this encounter.  Lab Orders  No laboratory test(s) ordered today    Other allergy screening: Asthma: {Blank single:19197::"yes","no"} Rhino conjunctivitis: {Blank single:19197::"yes","no"} Food allergy: {Blank single:19197::"yes","no"} Medication allergy: {Blank single:19197::"yes","no"} Hymenoptera allergy: {Blank single:19197::"yes","no"} Urticaria: {Blank single:19197::"yes","no"} Eczema:{Blank single:19197::"yes","no"} History of recurrent infections suggestive of immunodeficency: {Blank single:19197::"yes","no"}  Diagnostics: Spirometry:  Tracings reviewed. Her effort: {Blank single:19197::"Good reproducible efforts.","It  was hard to get consistent efforts and there is a question as to whether this reflects a maximal maneuver.","Poor effort, data can not be interpreted."} FVC: ***L FEV1: ***L, ***% predicted FEV1/FVC ratio: ***% Interpretation: {Blank single:19197::"Spirometry consistent with mild obstructive disease","Spirometry consistent with moderate obstructive disease","Spirometry consistent with severe obstructive disease","Spirometry consistent with possible restrictive disease","Spirometry consistent with mixed obstructive and restrictive disease","Spirometry uninterpretable due to technique","Spirometry consistent with normal pattern","No overt abnormalities noted given today's efforts"}.  Please see scanned spirometry results for details.  Skin Testing: {Blank single:19197::"Select foods","Environmental allergy panel","Environmental allergy panel and select foods","Food allergy panel","None","Deferred due to recent antihistamines use"}. *** Results discussed with patient/family.   Past Medical History: Patient Active Problem List   Diagnosis Date Noted  . Migraine with aura and without status migrainosus, not intractable 09/17/2019  . Encounter for procreative genetic counseling 01/06/2016  . Depression with anxiety 10/22/2012  . 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 General medical examination 12/27/2010  . Allergic dermatitis 11/15/2010  . IBS 08/02/2008  . SCOLIOSIS 06/11/2006   Past Medical History:  Diagnosis Date  . Anxiety   . Depression   . Encounter for procreative genetic counseling 01/06/2016  . IBS (irritable bowel syndrome)   . Irritable bowel syndrome   . Neuromuscular disorder ([place])   . Other preterm infants, unspecified (weight)(765.10)   . Scoliosis (and kyphoscoliosis), idiopathic   . Urinary tract infection, site not specified   . UTI (urinary tract infection)    Past Surgical History: Past Surgical History:  Procedure Laterality Date  . WISDOM TOOTH  EXTRACTION  2010   Medication List:  Current Outpatient Medications  Medication Sig Dispense Refill  . cholecalciferol (VITAMIN D3) 25 MCG (1000 UNIT) tablet Take 1,000 Units by mouth daily.    . Drospirenone (SLYND PO)     . LAVENDER OIL PO Take 1 capsule by mouth daily.    Marland Kitchen  Multiple Vitamin (MULTIVITAMIN) tablet Take 1 tablet by mouth daily.    . Omega-3 Fatty Acids (FISH OIL) 1000 MG CAPS Take 1 capsule by mouth daily.    . sertraline (ZOLOFT) 50 MG tablet Take 1 tablet (50 mg total) by mouth daily. 90 tablet 1  . SUMAtriptan (IMITREX) 100 MG tablet Take 1 tablet (100 mg total) by mouth as needed for migraine. May repeat in 2 hours if headache persists or recurs.  Maximum 2 tablets in 24 hours. 10 tablet 0   No current facility-administered medications for this visit.   Allergies: Allergies  Allergen Reactions  . Cephalexin     REACTION: Rash all over just before finishing the course last time.  . Nitrofurantoin     REACTION: rash and itching  . Other Hives and Other (See Comments)    Tree nuts- hives in mouth   Social History: Social History   Socioeconomic History  . Marital status: Married    Spouse name: Not on file  . Number of children: Not on file  . Years of education: Not on file  . Highest education level: Not on file  Occupational History  . Occupation: Ship broker at [employer]  . Smoking status: Never  . Smokeless tobacco: Never  Vaping Use  . Vaping Use: Never used  Substance and Sexual Activity  . Alcohol use: Yes    Comment: Socially  . Drug use: No  . Sexual activity: Yes    Birth control/protection: Pill  Other Topics Concern  . Not on file  Social History Narrative   Right handed   Lives with husband and daughter two story home   Social Determinants of Health   Financial Resource Strain: Not on file  Food Insecurity: Not on file  Transportation Needs: Not on file  Physical Activity: Not on file  Stress: Not on file   Social Connections: Not on file   Lives in a ***. Smoking: *** Occupation: ***  Environmental HistoryFreight forwarder in the house: Estate agent in the family room: {Blank single:19197::"yes","no"} Carpet in the bedroom: {Blank single:19197::"yes","no"} Heating: {Blank single:19197::"electric","gas","heat pump"} Cooling: {Blank single:19197::"central","window","heat pump"} Pet: {Blank single:19197::"yes ***","no"}  Family History: Family History  Problem Relation Age of Onset  . Mental illness Sister   . Hyperlipidemia Mother   . Alcohol abuse Father   . Arthritis Maternal Grandmother   . Diabetes Maternal Grandmother   . Cancer Paternal Grandfather    Problem                               Relation Asthma                                   *** Eczema                                *** Food allergy                          *** Allergic rhino conjunctivitis     ***  Review of Systems  Constitutional:  Negative for appetite change, chills, fever and unexpected weight change.  HENT:  Negative for congestion and rhinorrhea.   Eyes:  Negative for itching.  Respiratory:  Negative for cough, chest tightness, shortness  of breath and wheezing.   Cardiovascular:  Negative for chest pain.  Gastrointestinal:  Negative for abdominal pain.  Genitourinary:  Negative for difficulty urinating.  Skin:  Negative for rash.  Neurological:  Negative for headaches.   Objective: There were no vitals taken for this visit. There is no height or weight on file to calculate BMI. Physical Exam Vitals and nursing note reviewed.  Constitutional:      Appearance: Normal appearance. She is well-developed.  HENT:     Head: Normocephalic and atraumatic.     Right Ear: Tympanic membrane and external ear normal.     Left Ear: Tympanic membrane and external ear normal.     Nose: Nose normal.     Mouth/Throat:     Mouth: Mucous membranes are moist.     Pharynx:  Oropharynx is clear.  Eyes:     Conjunctiva/sclera: Conjunctivae normal.  Cardiovascular:     Rate and Rhythm: Normal rate and regular rhythm.     Heart sounds: Normal heart sounds. No murmur heard.    No friction rub. No gallop.  Pulmonary:     Effort: Pulmonary effort is normal.     Breath sounds: Normal breath sounds. No wheezing, rhonchi or rales.  Musculoskeletal:     Cervical back: Neck supple.  Skin:    General: Skin is warm.     Findings: No rash.  Neurological:     Mental Status: She is alert and oriented to person, place, and time.  Psychiatric:        Behavior: Behavior normal.  The plan was reviewed with the patient/family, and all questions/concerned were addressed.  It was my pleasure to see Karen Booker today and participate in her care. Please feel free to contact me with any questions or concerns.  Sincerely,  Rexene Alberts, DO Allergy & Immunology  [facility] office: [place] office: [phone]

## 2022-03-22 ENCOUNTER — Other Ambulatory Visit: Payer: Self-pay

## 2022-03-22 ENCOUNTER — Encounter: Payer: Self-pay | Admitting: Allergy

## 2022-03-22 ENCOUNTER — Ambulatory Visit (INDEPENDENT_AMBULATORY_CARE_PROVIDER_SITE_OTHER): Payer: BC Managed Care – PPO | Admitting: Allergy

## 2022-03-22 VITALS — BP 118/60 | HR 90 | Temp 98.3°F | Resp 18 | Ht 66.0 in | Wt 132.8 lb

## 2022-03-22 DIAGNOSIS — T781XXA Other adverse food reactions, not elsewhere classified, initial encounter: Secondary | ICD-10-CM

## 2022-03-22 DIAGNOSIS — L2089 Other atopic dermatitis: Secondary | ICD-10-CM

## 2022-03-22 DIAGNOSIS — L272 Dermatitis due to ingested food: Secondary | ICD-10-CM | POA: Diagnosis not present

## 2022-03-22 DIAGNOSIS — J3089 Other allergic rhinitis: Secondary | ICD-10-CM | POA: Diagnosis not present

## 2022-03-22 DIAGNOSIS — T7819XD Other adverse food reactions, not elsewhere classified, subsequent encounter: Secondary | ICD-10-CM

## 2022-03-22 DIAGNOSIS — T781XXD Other adverse food reactions, not elsewhere classified, subsequent encounter: Secondary | ICD-10-CM

## 2022-03-22 NOTE — Patient Instructions (Addendum)
Today's skin testing showed: Negative to tree nuts.   Results given.  Food Continue to avoid mangoes, cashews and almonds. For mild symptoms you can take over the counter antihistamines such as Benadryl and monitor symptoms closely. If symptoms worsen or if you have severe symptoms including breathing issues, throat closure, significant swelling, whole body hives, severe diarrhea and vomiting, lightheadedness then seek immediate medical care. Get bloodwork We are ordering labs, so please allow 1-2 weeks for the results to come back. With the newly implemented Cures Act, the labs might be visible to you at the same time that they become visible to me. However, I will not address the results until all of the results are back, so please be patient.  In the meantime, continue recommendations in your patient instructions, including avoidance measures (if applicable), until you hear from me.  Some concern for oral allergy syndrome. This is caused by cross reactivity of pollen with fresh fruits and vegetables, and nuts. Symptoms are usually localized in the form of itching and burning in mouth and throat. Very rarely it can progress to more severe symptoms. Eating foods in cooked or processed forms usually minimizes symptoms. I recommended avoidance of eating the problem foods, especially during the peak season(s). Sometimes, OFAS can induce severe throat swelling or even a systemic reaction; with such instance, I advised them to report to a local ER.   Environmental allergies History suspicious for pollen allergy - see below for environmental control measures. Use over the counter antihistamines such as Claritin (loratadine), Allegra (fexofenadine), daily as needed. May switch antihistamines every few months. If worsening symptoms, recommend allergy testing and allergy injections if have significant positives.  Follow up if needed.    Reducing Pollen Exposure Pollen seasons: trees (spring), grass  (summer) and ragweed/weeds (fall). Keep windows closed in your home and car to lower pollen exposure.  Install air conditioning in the bedroom and throughout the house if possible.  Avoid going out in dry windy days - especially early morning. Pollen counts are highest between 5 - 10 AM and on dry, hot and windy days.  Save outside activities for late afternoon or after a heavy rain, when pollen levels are lower.  Avoid mowing of grass if you have grass pollen allergy. Be aware that pollen can also be transported indoors on people and pets.  Dry your clothes in an automatic dryer rather than hanging them outside where they might collect pollen.  Rinse hair and eyes before bedtime.

## 2022-03-22 NOTE — Assessment & Plan Note (Signed)
On hands mainly in the winter months. Saw derm for this. Continue proper skin care.

## 2022-03-22 NOTE — Assessment & Plan Note (Signed)
Noted perioral pruritus/rash after eating a certain amounts of cashews, almonds and mangoes. Symptoms do not appear immediately sometimes and resolve within a few days. Tolerates pistachios and macadamia nuts. Has some spring time allergies. Today's skin testing showed: Negative to tree nuts.  Continue to avoid mangoes, cashews and almonds. For mild symptoms you can take over the counter antihistamines such as Benadryl and monitor symptoms closely. If symptoms worsen or if you have severe symptoms including breathing issues, throat closure, significant swelling, whole body hives, severe diarrhea and vomiting, lightheadedness then seek immediate medical care. Get bloodwork.  Some concern for oral allergy syndrome. This is caused by cross reactivity of pollen with fresh fruits and vegetables, and nuts. Symptoms are usually localized in the form of itching and burning in mouth and throat. Very rarely it can progress to more severe symptoms. Eating foods in cooked or processed forms usually minimizes symptoms. I recommended avoidance of eating the problem foods, especially during the peak season(s). Sometimes, OFAS can induce severe throat swelling or even a systemic reaction; with such instance, I advised them to report to a local ER.

## 2022-03-22 NOTE — Assessment & Plan Note (Signed)
Mainly flares in the spring and fall but does not take any meds for this.  History suspicious for pollen allergy - see below for environmental control measures. Use over the counter antihistamines such as Claritin (loratadine), Allegra (fexofenadine), daily as needed. May switch antihistamines every few months. If worsening symptoms, recommend allergy testing and allergy injections if have significant positives.

## 2022-04-03 ENCOUNTER — Ambulatory Visit: Payer: BC Managed Care – PPO | Admitting: Neurology

## 2022-04-16 NOTE — Progress Notes (Unsigned)
NEUROLOGY FOLLOW UP OFFICE NOTE  GORDANA MOMENT [id number]  Assessment/Plan:   Migraine without aura, without status migrainosus, intractable - unclear why severe now.  Neurologic exam is normal.  Given that this is not been established as an ongoing chronic problem and exam unremarkable, would hold off on imaging.   Migraine with aura, without status migrainosus, not intractable, stable  Migraine prevention:  Not indicated Migraine rescue:  sumatriptan.  If she continues to have adverse side effects, she will let me know and we can try rizatriptan.  Zofran for nausea Limit use of pain relievers to no more than 2 days out of week to prevent risk of rebound or medication-overuse headache. Keep headache diary Follow up 6 months   Subjective:  Karen Booker is a 32 year old right-handed female with IBS, anxiety and depression who follows up for migraines.  UPDATE: Duration:  2 to 3 hours Frequency:  1 to 3 times a month  Sumatriptan does cause dizzy and lightheadedness (which may be from the migraine as well).  She also notes soreness in the jaw.    Current NSAIDS/analgesics:  Tylenol Current triptans:  sumatriptan '100mg'$  Current ergotamine:  none Current anti-emetic:  Zofran '4mg'$   Current muscle relaxants:  none Current Antihypertensive medications:  none Current Antidepressant medications:  Sertraline '25mg'$  daily Current Anticonvulsant medications:  none Current anti-CGRP:  none Current Vitamins/Herbal/Supplements:  Fish oil, prenatal MVI Current Antihistamines/Decongestants:  none Other therapy:  none Hormone/birth control:  Slynd  HISTORY: She has had migraines since age 77.  They have increased in frequency in 2021, around the time she started lo-loestrin after weaning her baby off of breastfeeding.  She first experiences a visual aura described as a crescent shape in her right eye that enlarges and moves out of her visual field lasting 10 to 30 minutes followed by a mild  primarily right-sided pressure headache.  Headache lasts 1 to 2 hours.  Initially occurred about once a year to once every few years.  Then, they were occurring once a month.  Unsure if it correlated with her period because she has frequent breakthrough bleeding. There is associated nausea and fatigue.  There is no associated photophobia, phonophobia, numbness or weakness.  Unaware of any specific trigger.    Migraines with aura improved after being switched from estrogen to progesterone-only pill.   She would also have some headaches without aura about once a week.  They are severe throbbing pain beginning in back of neck/head wrapping around in bandlike distribution. Associated nausea, photophobia, phonophobia and osmophobia and rarely vomiting. Occur mid-day. Usually last a couple of hours with Tylenol and a nap and occure very 2 to 3 months.   Increased frequency in 2023    Past NSAIDS/analgesics:  none Past abortive triptans:  none Past abortive ergotamine:  none Past muscle relaxants:  none Past anti-emetic:  none Past antihypertensive medications:  none Past antidepressant medications:  none Past anticonvulsant medications:  none Past anti-CGRP:  none Past vitamins/Herbal/Supplements:  none Past antihistamines/decongestants:  none Other past therapies:  none  Caffeine:  2 cups coffee daily, 1 to 2 times a week a diet Coke or tea Diet:  40 oz water daily.  Skips lunch. Exercise:  No routine Depression:  no; Anxiety:  Yes.  Controlled on sertraline Other pain:  no Sleep hygiene:  good Family history of headache:  Mom (migriane), twin sister (migraine with aura)  PAST MEDICAL HISTORY: Past Medical History:  Diagnosis Date   Anxiety  Depression    Encounter for procreative genetic counseling 01/06/2016   IBS (irritable bowel syndrome)    Irritable bowel syndrome    Neuromuscular disorder ([place])    Other preterm infants, unspecified (weight)(765.10)    Scoliosis (and  kyphoscoliosis), idiopathic    Urinary tract infection, site not specified    UTI (urinary tract infection)     MEDICATIONS: Current Outpatient Medications on File Prior to Visit  Medication Sig Dispense Refill   cholecalciferol (VITAMIN D3) 25 MCG (1000 UNIT) tablet Take 1,000 Units by mouth daily.     Drospirenone (SLYND PO)      LAVENDER OIL PO Take 1 capsule by mouth daily.     Multiple Vitamin (MULTIVITAMIN) tablet Take 1 tablet by mouth daily.     Omega-3 Fatty Acids (FISH OIL) 1000 MG CAPS Take 1 capsule by mouth daily.     sertraline (ZOLOFT) 50 MG tablet Take 1 tablet (50 mg total) by mouth daily. 90 tablet 1   SUMAtriptan (IMITREX) 100 MG tablet Take 1 tablet (100 mg total) by mouth as needed for migraine. May repeat in 2 hours if headache persists or recurs.  Maximum 2 tablets in 24 hours. 10 tablet 0   No current facility-administered medications on file prior to visit.    ALLERGIES: Allergies  Allergen Reactions   Cephalexin     REACTION: Rash all over just before finishing the course last time.   Nitrofurantoin     REACTION: rash and itching   Other Hives and Other (See Comments)    Tree nuts- hives in mouth    FAMILY HISTORY: Family History  Problem Relation Age of Onset   Mental illness Sister    Hyperlipidemia Mother    Alcohol abuse Father    Arthritis Maternal Grandmother    Diabetes Maternal Grandmother    Cancer Paternal Grandfather       Objective:  Blood pressure 119/81, pulse 91, height '5\' 6"'$  (1.676 m), weight 132 lb 14.4 oz (60.3 kg), SpO2 99 %. General: No acute distress.  Patient appears well-groomed.      Karen Clines, DO  CC: Annye Asa, MD

## 2022-04-17 ENCOUNTER — Ambulatory Visit: Payer: BC Managed Care – PPO | Admitting: Neurology

## 2022-04-17 ENCOUNTER — Encounter: Payer: Self-pay | Admitting: Neurology

## 2022-04-17 VITALS — BP 119/81 | HR 91 | Ht 66.0 in | Wt 132.9 lb

## 2022-04-17 DIAGNOSIS — G43109 Migraine with aura, not intractable, without status migrainosus: Secondary | ICD-10-CM

## 2022-04-17 MED ORDER — SUMATRIPTAN SUCCINATE 100 MG PO TABS: 100.0000 mg | ORAL_TABLET | ORAL | 5 refills | Status: DC | PRN

## 2022-04-17 NOTE — Patient Instructions (Signed)
Sumatriptan as needed Limit use of pain relievers to no more than 2 days out of week to prevent risk of rebound or medication-overuse headache. Follow up 6 months.

## 2022-07-18 ENCOUNTER — Ambulatory Visit: Payer: BC Managed Care – PPO | Admitting: Neurology

## 2022-07-22 LAB — IGE NUT PROF. W/COMPONENT RFLX
F017-IgE Hazelnut (Filbert): <0.1 kU/L
F018-IgE Brazil Nut: <0.1 kU/L
F020-IgE Almond: <0.1 kU/L
F202-IgE Cashew Nut: <0.1 kU/L
F203-IgE Pistachio Nut: <0.1 kU/L
F256-IgE Walnut: <0.1 kU/L
Macadamia Nut, IgE: <0.1 kU/L
Peanut, IgE: <0.1 kU/L
Pecan Nut IgE: <0.1 kU/L

## 2022-07-22 LAB — ALLERGEN, MANGO, F91: Mango IgE: <0.1 kU/L

## 2022-08-06 ENCOUNTER — Other Ambulatory Visit: Payer: Self-pay

## 2022-08-06 DIAGNOSIS — F418 Other specified anxiety disorders: Secondary | ICD-10-CM

## 2022-08-06 MED ORDER — SERTRALINE HCL 50 MG PO TABS: 50.0000 mg | ORAL_TABLET | Freq: Every day | ORAL | 1 refills | Status: DC

## 2022-10-08 NOTE — Progress Notes (Unsigned)
NEUROLOGY FOLLOW UP OFFICE NOTE  [name] [id number]  Assessment/Plan:   Migraine without aura, without status migrainosus, intractable  Migraine with aura, without status migrainosus, not intractable, stable  Migraine prevention:  Not indicated Migraine rescue:  Will have her try rizatriptan-MLT 10mg  to see if more effective than sumatriptan.  We can try Ubrelvy if needed. Limit use of pain relievers to no more than 2 days out of week to prevent risk of rebound or medication-overuse headache. Keep headache diary Follow up 1 year.   Subjective:  Karen Booker is a 32 year old right-handed female with IBS, anxiety and depression who follows up for migraines.  UPDATE: Tolerating sumatriptan now.   Intensity:  6/10 (down from 8-9/10) Duration:  Takes sumatriptan and nap for 2 to 3 hours and has a 4/10 for next 2-3 hours.   Frequency:  1 to 3 times a month  Sumatriptan does cause dizzy and lightheadedness (which may be from the migraine as well).  She also notes soreness in the jaw.    Current NSAIDS/analgesics:  Tylenol Current triptans:  sumatriptan 100mg  Current ergotamine:  none Current anti-emetic:  Zofran 4mg   Current muscle relaxants:  none Current Antihypertensive medications:  none Current Antidepressant medications:  Sertraline 25mg  daily Current Anticonvulsant medications:  none Current anti-CGRP:  none Current Vitamins/Herbal/Supplements:  Fish oil, prenatal MVI Current Antihistamines/Decongestants:  none Other therapy:  none Hormone/birth control:  Slynd  HISTORY: She has had migraines since age 32.  They have increased in frequency in 2021, around the time she started lo-loestrin after weaning her baby off of breastfeeding.  She first experiences a visual aura described as a crescent shape in her right eye that enlarges and moves out of her visual field lasting 10 to 30 minutes followed by a mild primarily right-sided pressure headache.  Headache lasts 1  to 2 hours.  Initially occurred about once a year to once every few years.  Then, they were occurring once a month.  Unsure if it correlated with her period because she has frequent breakthrough bleeding. There is associated nausea and fatigue.  There is no associated photophobia, phonophobia, numbness or weakness.  Unaware of any specific trigger.    Migraines with aura improved after being switched from estrogen to progesterone-only pill.   She would also have some headaches without aura about once a week.  They are severe throbbing pain beginning in back of neck/head wrapping around in bandlike distribution. Associated nausea, photophobia, phonophobia and osmophobia and rarely vomiting. Occur mid-day. Usually last a couple of hours with Tylenol and a nap and occure very 2 to 3 months.   Increased frequency in 2023    Past NSAIDS/analgesics:  none Past abortive triptans:  none Past abortive ergotamine:  none Past muscle relaxants:  none Past anti-emetic:  none Past antihypertensive medications:  none Past antidepressant medications:  none Past anticonvulsant medications:  none Past anti-CGRP:  none Past vitamins/Herbal/Supplements:  none Past antihistamines/decongestants:  none Other past therapies:  none  Caffeine:  2 cups coffee daily, 1 to 2 times a week a diet Coke or tea Diet:  40 oz water daily.  Skips lunch. Exercise:  No routine Depression:  no; Anxiety:  Yes.  Controlled on sertraline Other pain:  no Sleep hygiene:  good Family history of headache:  Mom (migriane), twin sister (migraine with aura)  PAST MEDICAL HISTORY: Past Medical History:  Diagnosis Date   Anxiety    Depression    Encounter for procreative  genetic counseling 01/06/2016   IBS (irritable bowel syndrome)    Irritable bowel syndrome    Neuromuscular disorder (HCC)    Other preterm infants, unspecified (weight)(765.10)    Scoliosis (and kyphoscoliosis), idiopathic    Urinary tract infection, site not  specified    UTI (urinary tract infection)     MEDICATIONS: Current Outpatient Medications on File Prior to Visit  Medication Sig Dispense Refill   cholecalciferol (VITAMIN D3) 25 MCG (1000 UNIT) tablet Take 1,000 Units by mouth daily.     Drospirenone (SLYND PO)      LAVENDER OIL PO Take 1 capsule by mouth daily.     Multiple Vitamin (MULTIVITAMIN) tablet Take 1 tablet by mouth daily.     Omega-3 Fatty Acids (FISH OIL) 1000 MG CAPS Take 1 capsule by mouth daily.     sertraline (ZOLOFT) 50 MG tablet Take 1 tablet (50 mg total) by mouth daily. 90 tablet 1   SUMAtriptan (IMITREX) 100 MG tablet Take 1 tablet (100 mg total) by mouth as needed for migraine. May repeat in 2 hours if headache persists or recurs.  Maximum 2 tablets in 24 hours. 10 tablet 5   No current facility-administered medications on file prior to visit.    ALLERGIES: Allergies  Allergen Reactions   Cephalexin     REACTION: Rash all over just before finishing the course last time.   Nitrofurantoin     REACTION: rash and itching   Other Hives and Other (See Comments)    Tree nuts- hives in mouth    FAMILY HISTORY: Family History  Problem Relation Age of Onset   Mental illness Sister    Hyperlipidemia Mother    Alcohol abuse Father    Arthritis Maternal Grandmother    Diabetes Maternal Grandmother    Cancer Paternal Grandfather       Objective:  Blood pressure 107/68, pulse 84, height 5\' 7"  (1.702 m), weight 134 lb 6.4 oz (61 kg), SpO2 100%. General: No acute distress.  Patient appears well-groomed.   Head:  Normocephalic/atraumatic Neck:  Supple.  No paraspinal tenderness.  Full range of motion. Heart:  Regular rate and rhythm. Neuro:  Alert and oriented.  Speech fluent and not dysarthric.  Language intact.  CN II-XII intact.  Bulk and tone normal.  Muscle strength 5/5 throughout.  Deep tendon reflexes 2+ throughout.  Gait normal.  Romberg negative.    Shon Millet, DO  CC: Neena Rhymes,  MD

## 2022-10-10 ENCOUNTER — Encounter: Payer: Self-pay | Admitting: Neurology

## 2022-10-10 ENCOUNTER — Ambulatory Visit: Payer: BC Managed Care – PPO | Admitting: Neurology

## 2022-10-10 VITALS — BP 107/68 | HR 84 | Ht 67.0 in | Wt 134.4 lb

## 2022-10-10 DIAGNOSIS — G43109 Migraine with aura, not intractable, without status migrainosus: Secondary | ICD-10-CM | POA: Diagnosis not present

## 2022-10-10 MED ORDER — RIZATRIPTAN BENZOATE 10 MG PO TBDP: 10.0000 mg | ORAL_TABLET | ORAL | 5 refills | Status: DC | PRN

## 2022-10-10 NOTE — Patient Instructions (Signed)
Instead of sumatriptan, try rizatriptan at earliest onset of migraine.  May repeat after 2 hours.  Maximum 2 tablets in 24 hours.  Let me know if it works for you.  Do not take within 24 hours of sumatriptan

## 2023-01-31 ENCOUNTER — Telehealth: Payer: Self-pay

## 2023-01-31 MED ORDER — OSELTAMIVIR PHOSPHATE 75 MG PO CAPS: 75.0000 mg | ORAL_CAPSULE | Freq: Two times a day (BID) | ORAL | 0 refills | Status: DC

## 2023-01-31 NOTE — Telephone Encounter (Signed)
Copied from CRM [id number]. Topic: Clinical - Medication Refill >> Jan 31, 2023 10:41 AM Denese Killings wrote: Most Recent Primary Care Visit:  Provider: Sheliah Hatch  Department: [facility]  Visit Type: PHYSICAL  Date: 03/07/2022  Medication: Tamiflu pt stated that two people in her household tested positive for Influenza A and she wants to have medication on hand for herself.  Has the patient contacted their pharmacy? No never took medicine before (Agent: If no, request that the patient contact the pharmacy for the refill. If patient does not wish to contact the pharmacy document the reason why and proceed with request.) (Agent: If yes, when and what did the pharmacy advise?)  Is this the correct pharmacy for this prescription? Yes If no, delete pharmacy and type the correct one.  This is the patient's preferred pharmacy:  [facility] [id number] [place], [place] - [address] [place] [address] Phone: [phone] Fax: [phone]   Has the prescription been filled recently? No  Is the patient out of the medication? No  Has the patient been seen for an appointment in the last year OR does the patient have an upcoming appointment? Yes  Can we respond through MyChart? Yes  Agent: Please be advised that Rx refills may take up to 3 business days. We ask that you follow-up with your pharmacy.

## 2023-01-31 NOTE — Addendum Note (Signed)
Addended by: Sheliah Hatch on: 01/31/2023 11:25 AM   Modules accepted: Orders

## 2023-01-31 NOTE — Telephone Encounter (Signed)
Prescription for Tamiflu sent to pharmacy.  I hope everyone feels better soon!

## 2023-02-01 NOTE — Telephone Encounter (Signed)
Patient has been informed.

## 2023-02-04 ENCOUNTER — Encounter: Payer: Self-pay | Admitting: Family Medicine

## 2023-02-04 DIAGNOSIS — F418 Other specified anxiety disorders: Secondary | ICD-10-CM

## 2023-02-04 MED ORDER — SERTRALINE HCL 50 MG PO TABS: 50.0000 mg | ORAL_TABLET | Freq: Every day | ORAL | 1 refills | Status: DC

## 2023-02-04 NOTE — Addendum Note (Signed)
Addended by: Sheliah Hatch on: 02/04/2023 04:23 PM   Modules accepted: Orders

## 2023-02-05 ENCOUNTER — Telehealth: Payer: Self-pay

## 2023-02-05 MED ORDER — OSELTAMIVIR PHOSPHATE 75 MG PO CAPS: 75.0000 mg | ORAL_CAPSULE | Freq: Two times a day (BID) | ORAL | 0 refills | Status: DC

## 2023-02-05 NOTE — Telephone Encounter (Signed)
Prescription for Tamiflu sent to pharmacy

## 2023-02-05 NOTE — Telephone Encounter (Signed)
Patient was provided Tamiflu last week due to family having flu pt was taking once daily but tested positive for the flu this morning,  Please advise directions should she start a new Rx for tamiflu or just finish the one she has

## 2023-02-05 NOTE — Telephone Encounter (Signed)
Copied from CRM [id number]. Topic: Clinical - Medication Question >> Feb 05, 2023 10:34 AM Denese Killings wrote: Reason for CRM: Patient has been on Tamiflu since last Thursday and has only been taking it once per day since she did not have the flu. Patient stated that she started to feel sick last night and just tested positive for the flu. Patient wants to know If we can send in another prescription for tamiflu or should she keep taking the current bottle that she has.

## 2023-02-05 NOTE — Addendum Note (Signed)
Addended by: Sheliah Hatch on: 02/05/2023 10:51 AM   Modules accepted: Orders

## 2023-02-05 NOTE — Telephone Encounter (Signed)
Pt has been informed.

## 2023-02-18 NOTE — Telephone Encounter (Signed)
 I have provided this feedback to [facility]

## 2023-02-24 IMAGING — DX DG KNEE COMPLETE 4+V*R*
4 series · 4 of 4 positions shown · non-contrast
Comparison: None.

CLINICAL DATA: Pain

EXAM:
RIGHT KNEE - COMPLETE 4+ VIEW

[knee ap]
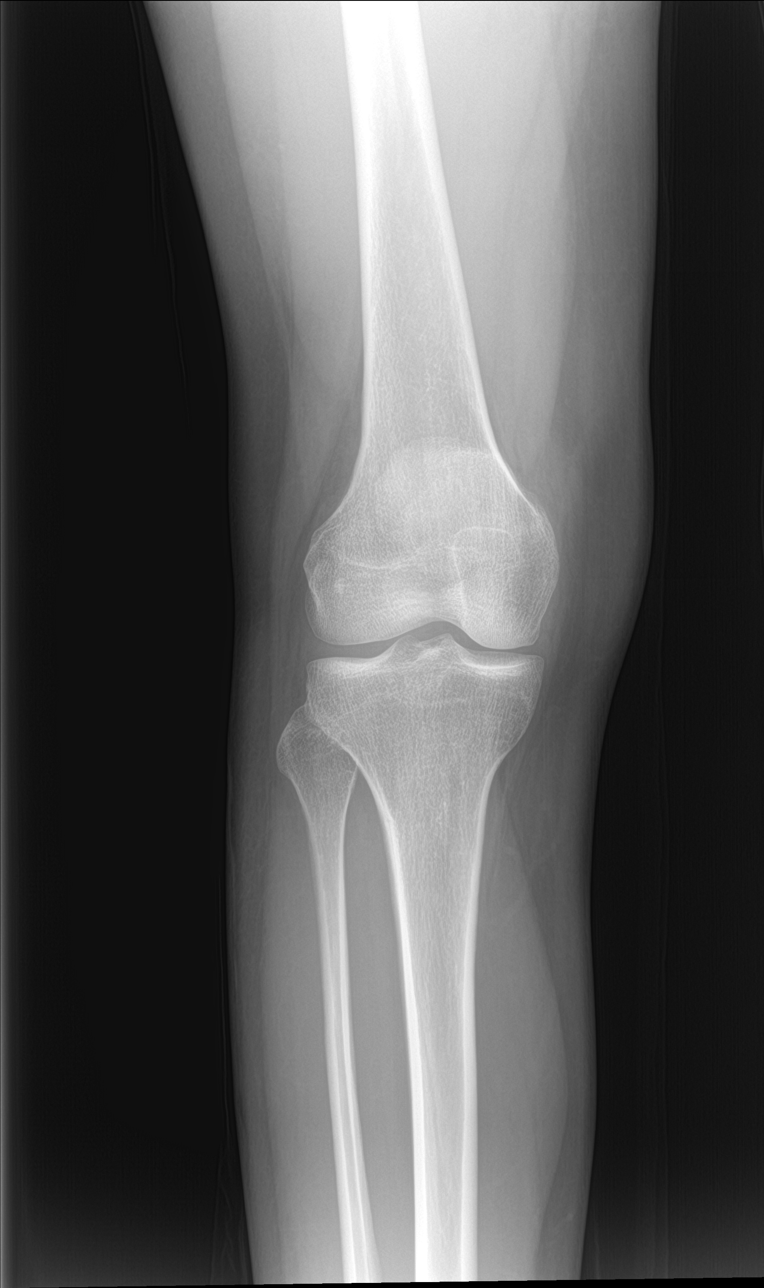

[knee obl (1 of 2)]
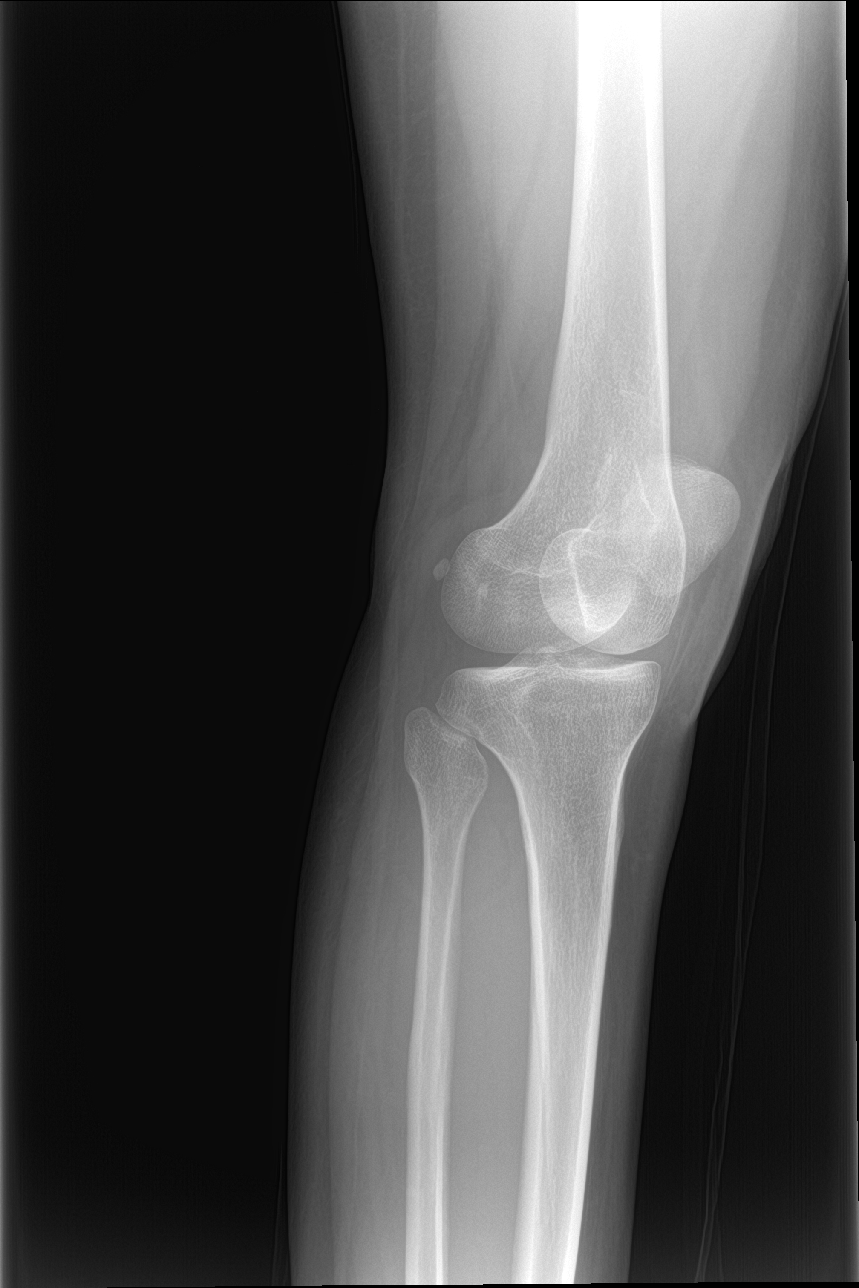

[knee obl (2 of 2)]
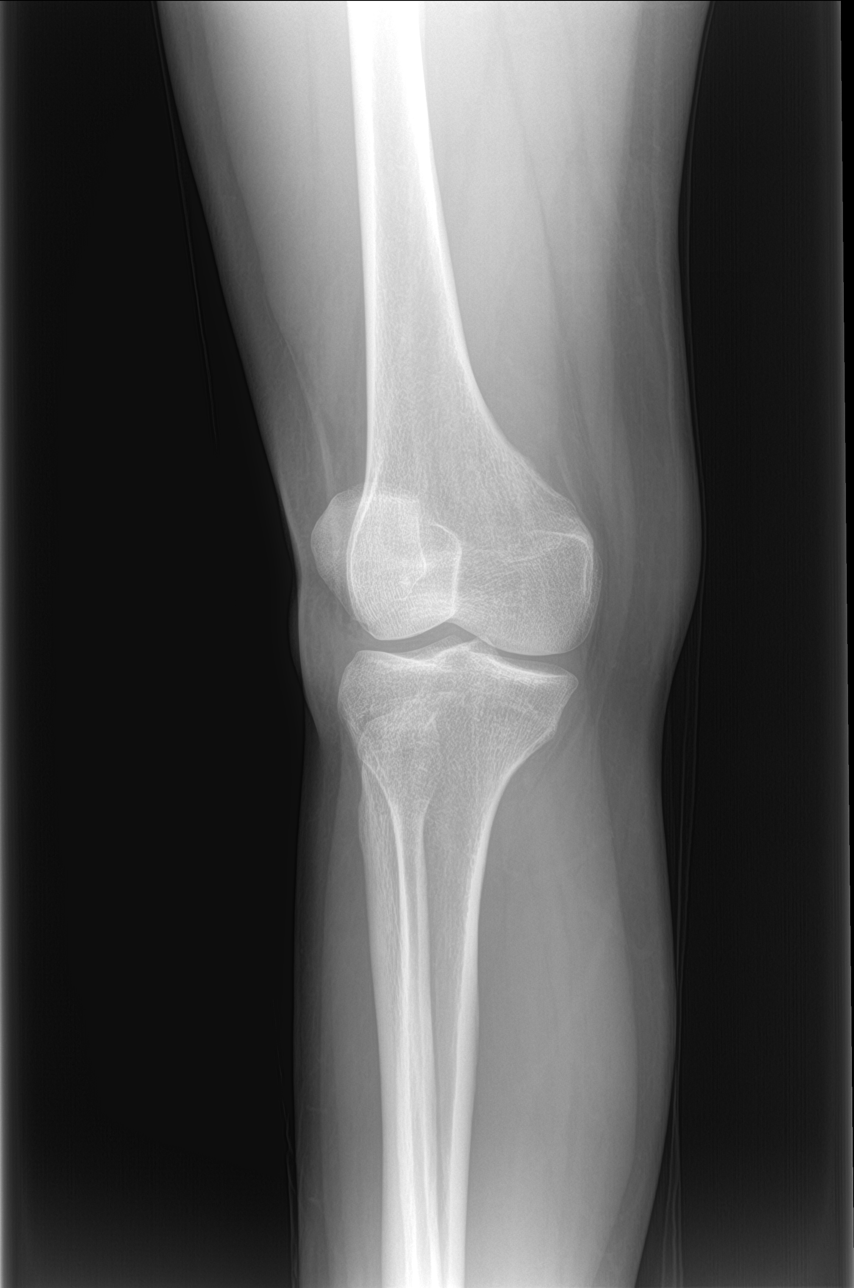

[knee [person_name]]
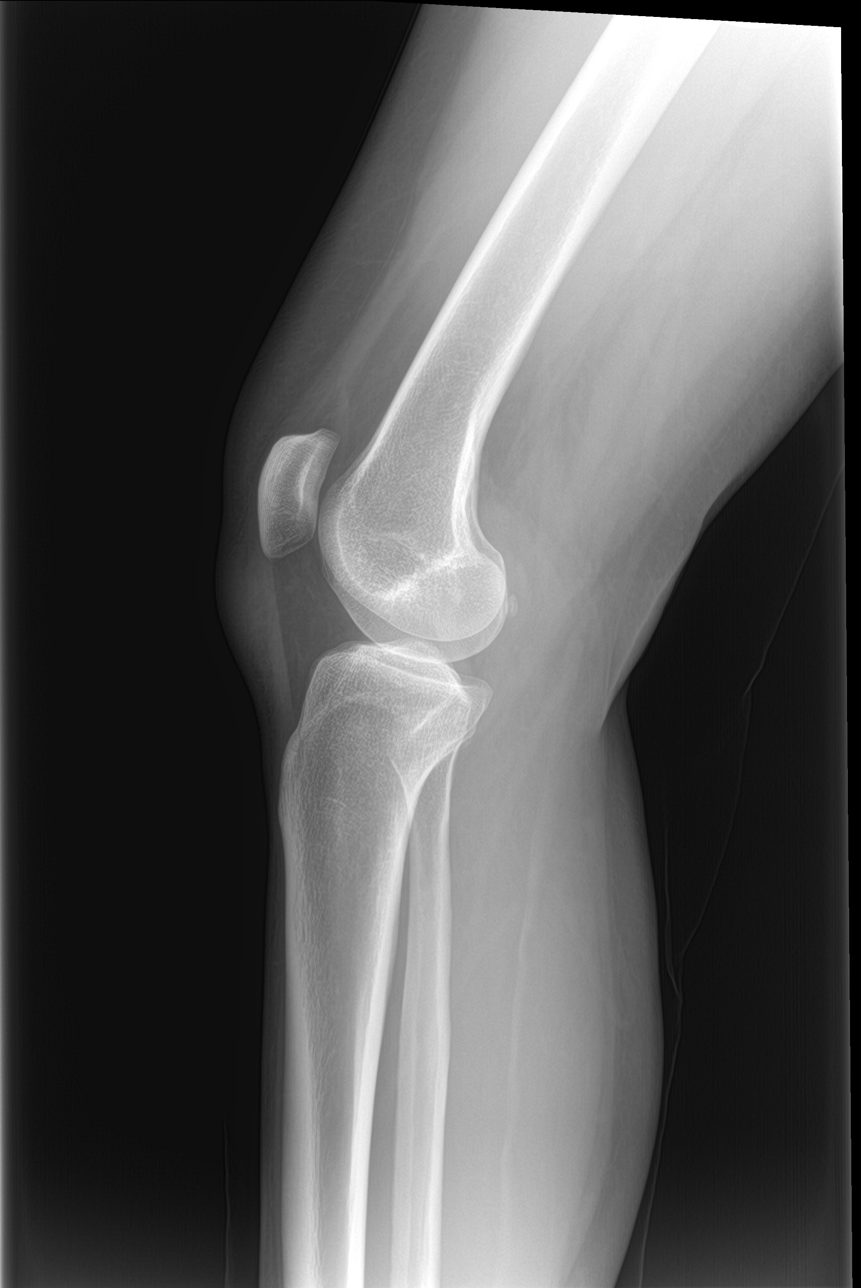

[4 of 4 positions shown; findings below may reference images not displayed]

FINDINGS: No acute fracture or dislocation. Joint spaces and alignment are
maintained. No area of erosion or osseous destruction. No unexpected
radiopaque foreign body. No joint effusion. Soft tissue swelling
overlying the superficial knee anteriorly.
IMPRESSION: Soft tissue swelling overlying the superficial knee anteriorly. No
acute osseous abnormality.

## 2023-02-28 ENCOUNTER — Telehealth: Payer: Self-pay | Admitting: Neurology

## 2023-02-28 NOTE — Telephone Encounter (Signed)
Mychart message sent with question how often and the intensity of the migraines?

## 2023-02-28 NOTE — Telephone Encounter (Signed)
Pt called and LM, she wants to see jaffe sooner than her scheduled appt. She is having increased migraines with aura

## 2023-03-01 MED ORDER — UBRELVY 100 MG PO TABS: ORAL_TABLET | ORAL | Status: DC

## 2023-03-01 NOTE — Addendum Note (Signed)
Addended by: Karl Luke A on: 03/01/2023 10:01 AM   Modules accepted: Orders

## 2023-03-11 ENCOUNTER — Encounter: Payer: Self-pay | Admitting: Family Medicine

## 2023-03-11 ENCOUNTER — Ambulatory Visit (INDEPENDENT_AMBULATORY_CARE_PROVIDER_SITE_OTHER): Payer: 59 | Admitting: Family Medicine

## 2023-03-11 VITALS — BP 102/64 | HR 91 | Temp 97.7°F | Ht 66.0 in | Wt 132.1 lb

## 2023-03-11 DIAGNOSIS — Z0000 Encounter for general adult medical examination without abnormal findings: Secondary | ICD-10-CM

## 2023-03-11 LAB — CBC WITH DIFFERENTIAL/PLATELET
Basophils Absolute: 0 10*3/uL (ref 0.0–0.1)
Basophils Relative: 1 % (ref 0.0–3.0)
Eosinophils Absolute: 0.2 10*3/uL (ref 0.0–0.7)
Eosinophils Relative: 4.2 % (ref 0.0–5.0)
HCT: 43.7 % (ref 36.0–46.0)
Hemoglobin: 14.8 g/dL (ref 12.0–15.0)
Lymphocytes Relative: 33.1 % (ref 12.0–46.0)
Lymphs Abs: 1.6 10*3/uL (ref 0.7–4.0)
MCHC: 33.9 g/dL (ref 30.0–36.0)
MCV: 98.5 fL (ref 78.0–100.0)
Monocytes Absolute: 0.4 10*3/uL (ref 0.1–1.0)
Monocytes Relative: 8.4 % (ref 3.0–12.0)
Neutro Abs: 2.5 10*3/uL (ref 1.4–7.7)
Neutrophils Relative %: 53.3 % (ref 43.0–77.0)
Platelets: 325 10*3/uL (ref 150.0–400.0)
RBC: 4.44 Mil/uL (ref 3.87–5.11)
RDW: 11.9 % (ref 11.5–15.5)
WBC: 4.7 10*3/uL (ref 4.0–10.5)

## 2023-03-11 LAB — BASIC METABOLIC PANEL
BUN: 12 mg/dL (ref 6–23)
CO2: 26 meq/L (ref 19–32)
Calcium: 9.7 mg/dL (ref 8.4–10.5)
Chloride: 104 meq/L (ref 96–112)
Creatinine, Ser: 0.7 mg/dL (ref 0.40–1.20)
GFR: 112.36 mL/min (ref >60.00)
Glucose, Bld: 98 mg/dL (ref 70–99)
Potassium: 4.5 meq/L (ref 3.5–5.1)
Sodium: 137 meq/L (ref 135–145)

## 2023-03-11 LAB — HEPATIC FUNCTION PANEL
ALT: 25 U/L (ref 0–35)
AST: 24 U/L (ref 0–37)
Albumin: 4.8 g/dL (ref 3.5–5.2)
Alkaline Phosphatase: 63 U/L (ref 39–117)
Bilirubin, Direct: 0.2 mg/dL (ref 0.0–0.3)
Total Bilirubin: 0.9 mg/dL (ref 0.2–1.2)
Total Protein: 7.4 g/dL (ref 6.0–8.3)

## 2023-03-11 LAB — TSH: TSH: 1.6 u[IU]/mL (ref 0.35–5.50)

## 2023-03-11 LAB — LIPID PANEL
Cholesterol: 138 mg/dL (ref 0–200)
HDL: 68.2 mg/dL (ref >39.00)
LDL Cholesterol: 59 mg/dL (ref 0–99)
NonHDL: 70.23
Total CHOL/HDL Ratio: 2
Triglycerides: 58 mg/dL (ref 0.0–149.0)
VLDL: 11.6 mg/dL (ref 0.0–40.0)

## 2023-03-11 NOTE — Assessment & Plan Note (Signed)
Pt's PE WNL.  UTD on pap, immunizations.  Check labs.  Anticipatory guidance provided.

## 2023-03-11 NOTE — Progress Notes (Signed)
   Subjective:    Patient ID: Karen Booker, female    DOB: [birth year], 33 y.o.   MRN: [id number]  HPI CPE- UTD on pap, Tdap, flu.  No concerns.  Patient Care Team    Relationship Specialty Notifications Start End  Sheliah Hatch, MD PCP - General Family Medicine  03/22/22   Ranae Pila, MD Consulting Physician Obstetrics and Gynecology  09/17/19   Drema Dallas, DO Consulting Physician Neurology  04/17/22     Health Maintenance  Topic Date Due   Cervical Cancer Screening (HPV/Pap Cotest)  05/05/2024   DTaP/Tdap/Td (2 - Td or Tdap) 07/23/2027   INFLUENZA VACCINE  Completed   COVID-19 Vaccine  Completed   Hepatitis C Screening  Completed   HIV Screening  Completed   HPV VACCINES  Aged Out      Review of Systems Patient reports no vision/ hearing changes, adenopathy,fever, weight change,  persistant/recurrent hoarseness , swallowing issues, chest pain, palpitations, edema, persistant/recurrent cough, hemoptysis, dyspnea (rest/exertional/paroxysmal nocturnal), gastrointestinal bleeding (melena, rectal bleeding), abdominal pain, significant heartburn, bowel changes, GU symptoms (dysuria, hematuria, incontinence), Gyn symptoms (abnormal  bleeding, pain),  syncope, focal weakness, memory loss, numbness & tingling, skin/hair/nail changes, abnormal bruising or bleeding, anxiety, or depression.     Objective:   Physical Exam General Appearance:    Alert, cooperative, no distress, appears stated age  Head:    Normocephalic, without obvious abnormality, atraumatic  Eyes:    PERRL, conjunctiva/corneas clear, EOM's intact both eyes  Ears:    Normal TM's and external ear canals, both ears  Nose:   Nares normal, septum midline, mucosa normal, no drainage    or sinus tenderness  Throat:   Lips, mucosa, and tongue normal; teeth and gums normal  Neck:   Supple, symmetrical, trachea midline, no adenopathy;    Thyroid: no enlargement/tenderness/nodules  Back:     Symmetric, no curvature,  ROM normal, no CVA tenderness  Lungs:     Clear to auscultation bilaterally, respirations unlabored  Chest Wall:    No tenderness or deformity   Heart:    Regular rate and rhythm, S1 and S2 normal, no murmur, rub   or gallop  Breast Exam:    Deferred to GYN  Abdomen:     Soft, non-tender, bowel sounds active all four quadrants,    no masses, no organomegaly  Genitalia:    Deferred to GYN  Rectal:    Extremities:   Extremities normal, atraumatic, no cyanosis or edema  Pulses:   2+ and symmetric all extremities  Skin:   Skin color, texture, turgor normal, no rashes or lesions  Lymph nodes:   Cervical, supraclavicular, and axillary nodes normal  Neurologic:   CNII-XII intact, normal strength, sensation and reflexes    throughout          Assessment & Plan:

## 2023-03-11 NOTE — Patient Instructions (Signed)
Follow up in 1 year or as needed We'll notify you of your lab results and make any changes if needed Keep up the good work!  You look great! Call with any questions or concerns Stay Safe!  Stay Healthy! Happy Belated Birthday!!!

## 2023-03-11 NOTE — Telephone Encounter (Signed)
Okay for patient to have virtual for tension headaches?

## 2023-03-12 ENCOUNTER — Telehealth: Payer: Self-pay

## 2023-03-12 NOTE — Telephone Encounter (Signed)
-----   Message from Neena Rhymes sent at 03/11/2023  8:00 PM EST ----- Labs look great!  No changes at this time

## 2023-03-12 NOTE — Telephone Encounter (Signed)
Pt has reviewed labs via MyChart

## 2023-03-15 ENCOUNTER — Telehealth: Payer: 59 | Admitting: Family Medicine

## 2023-03-15 ENCOUNTER — Encounter: Payer: Self-pay | Admitting: Family Medicine

## 2023-03-15 DIAGNOSIS — G44209 Tension-type headache, unspecified, not intractable: Secondary | ICD-10-CM | POA: Diagnosis not present

## 2023-03-15 MED ORDER — METHOCARBAMOL 500 MG PO TABS: 500.0000 mg | ORAL_TABLET | Freq: Three times a day (TID) | ORAL | 0 refills | Status: DC | PRN

## 2023-03-15 MED ORDER — MELOXICAM 15 MG PO TABS: 15.0000 mg | ORAL_TABLET | Freq: Every day | ORAL | 1 refills | Status: DC

## 2023-03-15 NOTE — Progress Notes (Signed)
Virtual Visit via Video   I connected with patient on 03/15/23 at  8:40 AM EST by a video enabled telemedicine application and verified that I am speaking with the correct person using two identifiers.  Location patient: Home Location provider: [facility], Office Persons participating in the virtual visit: Patient, Provider, CMA Archie Patten H)  I discussed the limitations of evaluation and management by telemedicine and the availability of in person appointments. The patient expressed understanding and agreed to proceed.  Subjective:   HPI:   Migraines- pt reports Dr Everlena Cooper is managing migraines but she is having increased tension HA's.  She is under a lot of stress at work and with all the recent world changes.  Is having R sided neck pain that radiates down into shoulder and up into head.  No relief w/ Triptan.  Denies light sensitivity or nausea.  No aura.  No fever.  ROS:   See pertinent positives and negatives per HPI.  Patient Active Problem List   Diagnosis Date Noted   Other adverse food reactions, not elsewhere classified, subsequent encounter 03/22/2022   Other allergic rhinitis 03/22/2022   Other atopic dermatitis 03/22/2022   Migraine with aura and without status migrainosus, not intractable 09/17/2019   Encounter for procreative genetic counseling 01/06/2016   Depression with anxiety 10/22/2012   Herpes simplex type 1 infection 05/28/2011   General medical examination 12/27/2010   Allergic dermatitis 11/15/2010   IBS 08/02/2008   Idiopathic scoliosis and kyphoscoliosis 06/11/2006    Social History   Tobacco Use   Smoking status: Never   Smokeless tobacco: Never  Substance Use Topics   Alcohol use: Yes    Comment: Socially    Current Outpatient Medications:    cholecalciferol (VITAMIN D3) 25 MCG (1000 UNIT) tablet, Take 1,000 Units by mouth daily., Disp: , Rfl:    Drospirenone (SLYND PO), , Disp: , Rfl:    LAVENDER OIL PO, Take 1 capsule by mouth  daily., Disp: , Rfl:    Multiple Vitamin (MULTIVITAMIN) tablet, Take 1 tablet by mouth daily., Disp: , Rfl:    Omega-3 Fatty Acids (FISH OIL) 1000 MG CAPS, Take 1 capsule by mouth daily., Disp: , Rfl:    oseltamivir (TAMIFLU) 75 MG capsule, Take 1 capsule (75 mg total) by mouth 2 (two) times daily., Disp: 10 capsule, Rfl: 0   rizatriptan (MAXALT-MLT) 10 MG disintegrating tablet, Take 1 tablet (10 mg total) by mouth as needed for migraine. May repeat in 2 hours if needed.  Maximum 2 tablets in 24 hours., Disp: 10 tablet, Rfl: 5   sertraline (ZOLOFT) 50 MG tablet, Take 1 tablet (50 mg total) by mouth daily., Disp: 90 tablet, Rfl: 1   SUMAtriptan (IMITREX) 100 MG tablet, Take 1 tablet (100 mg total) by mouth as needed for migraine. May repeat in 2 hours if headache persists or recurs.  Maximum 2 tablets in 24 hours., Disp: 10 tablet, Rfl: 5   Ubrogepant (UBRELVY) 100 MG TABS, Medication Samples have been provided to the patient.  Drug name: Bernita Raisin       Strength: 100mg         Qty: 4 boxes  LOT: [id number]  Exp.Date: 02/2024  Dosing instructions:   The patient has been instructed regarding the correct time, dose, and frequency of taking this medication, including desired effects and most common side effects.   Mahina A Allen 10:00 AM 03/01/2023, Disp: , Rfl:   Allergies  Allergen Reactions   Cephalexin     REACTION:  Rash all over just before finishing the course last time.   Nitrofurantoin     REACTION: rash and itching   Other Hives and Other (See Comments)    Tree nuts- hives in mouth    Objective:   There were no vitals taken for this visit. AAOx3, NAD NCAT, EOMI No obvious CN deficits Coloring WNL Pt is able to speak clearly, coherently without shortness of breath or increased work of breathing.  Thought process is linear.  Mood is appropriate.   Assessment and Plan:   Tension headache- new.  Pt has hx of similar years ago.  Has had increased stress recently.  Has tightness and spasm of  traps, R>L.  Will start scheduled NSAID and muscle relaxer prn.  Encouraged heat, massage, postural awareness.  Pt expressed understanding and is in agreement w/ plan.    Neena Rhymes, MD 03/15/2023

## 2023-05-13 ENCOUNTER — Encounter: Payer: Self-pay | Admitting: Neurology

## 2023-07-31 ENCOUNTER — Other Ambulatory Visit: Payer: Self-pay

## 2023-07-31 DIAGNOSIS — F418 Other specified anxiety disorders: Secondary | ICD-10-CM

## 2023-07-31 MED ORDER — SERTRALINE HCL 50 MG PO TABS: 50.0000 mg | ORAL_TABLET | Freq: Every day | ORAL | 1 refills | Status: DC

## 2023-09-17 NOTE — Progress Notes (Deleted)
 NEUROLOGY FOLLOW UP OFFICE NOTE  EH SAUSEDA [id number]  Assessment/Plan:   Migraine without aura, without status migrainosus, intractable  Migraine with aura, without status migrainosus, not intractable, stable  Migraine prevention:  Not indicated Migraine rescue:  *** Limit use of pain relievers to no more than 9 days out of the month to prevent risk of rebound or medication-overuse headache. Keep headache diary Follow up 1 year.   Subjective:  Karen Booker is a 33 year old right-handed female with IBS, anxiety and depression who follows up for migraines.  UPDATE: Because she was no longer tolerating sumatriptan , she was changed to rizatriptan -MLT, which was ineffective.  She tried samples of Ubrelvy  *** Intensity:  6/10 Duration:  *** Frequency:  1 to 3 times a month  Current NSAIDS/analgesics:  Tylenol  Current triptans:  none Current ergotamine:  none Current anti-emetic:  Zofran  4mg   Current muscle relaxants:  none Current Antihypertensive medications:  none Current Antidepressant medications:  Sertraline  25mg  daily Current Anticonvulsant medications:  none Current anti-CGRP:  none Current Vitamins/Herbal/Supplements:  Fish oil, prenatal MVI Current Antihistamines/Decongestants:  none Other therapy:  none Hormone/birth control:  Slynd  HISTORY: She has had migraines since age 32.  They have increased in frequency in 2021, around the time she started lo-loestrin after weaning her baby off of breastfeeding.  She first experiences a visual aura described as a crescent shape in her right eye that enlarges and moves out of her visual field lasting 10 to 30 minutes followed by a mild primarily right-sided pressure headache.  Headache lasts 1 to 2 hours.  Initially occurred about once a year to once every few years.  Then, they were occurring once a month.  Unsure if it correlated with her period because she has frequent breakthrough bleeding. There is associated nausea  and fatigue.  There is no associated photophobia, phonophobia, numbness or weakness.  Unaware of any specific trigger.    Migraines with aura improved after being switched from estrogen to progesterone-only pill.   She would also have some headaches without aura about once a week.  They are severe throbbing pain beginning in back of neck/head wrapping around in bandlike distribution. Associated nausea, photophobia, phonophobia and osmophobia and rarely vomiting. Occur mid-day. Usually last a couple of hours with Tylenol  and a nap and occure very 2 to 3 months.   Increased frequency in 2023    Past NSAIDS/analgesics:  none Past abortive triptans:  sumatriptan  100mg  (intolerance), Maxalt -MLT 10mg  Past abortive ergotamine:  none Past muscle relaxants:  none Past anti-emetic:  none Past antihypertensive medications:  none Past antidepressant medications:  none Past anticonvulsant medications:  none Past anti-CGRP:  Ubrelvy  100mg  *** Past vitamins/Herbal/Supplements:  none Past antihistamines/decongestants:  none Other past therapies:  none  Caffeine:  2 cups coffee daily, 1 to 2 times a week a diet Coke or tea Diet:  40 oz water daily.  Skips lunch. Exercise:  No routine Depression:  no; Anxiety:  Yes.  Controlled on sertraline  Other pain:  no Sleep hygiene:  good Family history of headache:  Mom (migriane), twin sister (migraine with aura)  PAST MEDICAL HISTORY: Past Medical History:  Diagnosis Date   Anxiety    Depression    Encounter for procreative genetic counseling 01/06/2016   IBS (irritable bowel syndrome)    Irritable bowel syndrome    Neuromuscular disorder (HCC)    Other preterm infants, unspecified (weight)(765.10)    Scoliosis (and kyphoscoliosis), idiopathic    Urinary tract infection,  site not specified    UTI (urinary tract infection)     MEDICATIONS: Current Outpatient Medications on File Prior to Visit  Medication Sig Dispense Refill   cholecalciferol  (VITAMIN D3) 25 MCG (1000 UNIT) tablet Take 1,000 Units by mouth daily.     Drospirenone (SLYND PO)      LAVENDER OIL PO Take 1 capsule by mouth daily.     meloxicam  (MOBIC ) 15 MG tablet Take 1 tablet (15 mg total) by mouth daily. 30 tablet 1   methocarbamol  (ROBAXIN ) 500 MG tablet Take 1 tablet (500 mg total) by mouth every 8 (eight) hours as needed for muscle spasms. 45 tablet 0   Multiple Vitamin (MULTIVITAMIN) tablet Take 1 tablet by mouth daily.     Omega-3 Fatty Acids (FISH OIL) 1000 MG CAPS Take 1 capsule by mouth daily.     oseltamivir  (TAMIFLU ) 75 MG capsule Take 1 capsule (75 mg total) by mouth 2 (two) times daily. 10 capsule 0   rizatriptan  (MAXALT -MLT) 10 MG disintegrating tablet Take 1 tablet (10 mg total) by mouth as needed for migraine. May repeat in 2 hours if needed.  Maximum 2 tablets in 24 hours. 10 tablet 5   sertraline  (ZOLOFT ) 50 MG tablet Take 1 tablet (50 mg total) by mouth daily. 90 tablet 1   SUMAtriptan  (IMITREX ) 100 MG tablet Take 1 tablet (100 mg total) by mouth as needed for migraine. May repeat in 2 hours if headache persists or recurs.  Maximum 2 tablets in 24 hours. 10 tablet 5   Ubrogepant  (UBRELVY ) 100 MG TABS Medication Samples have been provided to the patient.  Drug name: Ubrelvy        Strength: 100mg         Qty: 4 boxes  LOT: [id number]  Exp.Date: 02/2024  Dosing instructions:   The patient has been instructed regarding the correct time, dose, and frequency of taking this medication, including desired effects and most common side effects.   Karen Booker 10:00 AM 03/01/2023     No current facility-administered medications on file prior to visit.    ALLERGIES: Allergies  Allergen Reactions   Cephalexin     REACTION: Rash all over just before finishing the course last time.   Nitrofurantoin     REACTION: rash and itching   Other Hives and Other (See Comments)    Tree nuts- hives in mouth    FAMILY HISTORY: Family History  Problem Relation Age  of Onset   Mental illness Sister    Hyperlipidemia Mother    Alcohol abuse Father    Arthritis Maternal Grandmother    Diabetes Maternal Grandmother    Cancer Paternal Grandfather       Objective:  *** General: No acute distress.  Patient appears well-groomed.   Head:  Normocephalic/atraumatic Neck:  Supple.  No paraspinal tenderness.  Full range of motion. Heart:  Regular rate and rhythm. Neuro:  Alert and oriented.  Speech fluent and not dysarthric.  Language intact.  CN II-XII intact.  Bulk and tone normal.  Muscle strength 5/5 throughout.  Sensation to light touch intact.  Deep tendon reflexes 2+ throughout, toes downgoing.  Gait normal.  Romberg negative.     Juliene Dunnings, DO  CC: Comer Greet, MD

## 2023-09-18 ENCOUNTER — Ambulatory Visit: Admitting: Neurology

## 2023-09-26 NOTE — Progress Notes (Signed)
 NEUROLOGY FOLLOW UP OFFICE NOTE  Karen Booker [id number]  Assessment/Plan:   Migraine without aura, without status migrainosus, intractable  Migraine with aura, without status migrainosus, not intractable, stable  Migraine prevention:  Not indicated Migraine rescue:  Ubrelvy  100mg  or rizatriptan -MLT 10mg  Limit use of pain relievers to no more than 9 days out of the month to prevent risk of rebound or medication-overuse headache. Keep headache diary Follow up 1 year.   Subjective:  Karen Booker is a 33 year old right-handed female with IBS, anxiety and depression who follows up for migraines.  UPDATE: Because she was no longer tolerating sumatriptan , she was changed to rizatriptan -MLT, which had an unpleasant taste but effective..  She tried samples of Ubrelvy  which is effective Doing well.   Intensity:  6/10 Duration:  within 30 minutes with Ubrelvy , 30-60 minutes with rizatriptan  (on occasion takes 2) Frequency:  2 to 3 times a month She has not had a migraine with aura for at least a year  Current NSAIDS/analgesics:  Tylenol  Current triptans:  rizatriptan  10mg  Current ergotamine:  none Current anti-emetic:  Zofran  4mg   Current muscle relaxants:  none Current Antihypertensive medications:  none Current Antidepressant medications:  Sertraline  25mg  daily Current Anticonvulsant medications:  none Current anti-CGRP:  Ubrelvy  100mg  (samples). Current Vitamins/Herbal/Supplements:  Fish oil, prenatal MVI Current Antihistamines/Decongestants:  none Other therapy:  none Hormone/birth control:  Slynd  HISTORY: She has had migraines since age 53.  They have increased in frequency in 2021, around the time she started lo-loestrin after weaning her baby off of breastfeeding.  She first experiences a visual aura described as a crescent shape in her right eye that enlarges and moves out of her visual field lasting 10 to 30 minutes followed by a mild primarily right-sided pressure  headache.  Headache lasts 1 to 2 hours.  Initially occurred about once a year to once every few years.  Then, they were occurring once a month.  Unsure if it correlated with her period because she has frequent breakthrough bleeding. There is associated nausea and fatigue.  There is no associated photophobia, phonophobia, numbness or weakness.  Unaware of any specific trigger.    Migraines with aura improved after being switched from estrogen to progesterone-only pill.   She would also have some headaches without aura about once a week.  They are severe throbbing pain beginning in back of neck/head wrapping around in bandlike distribution. Associated nausea, photophobia, phonophobia and osmophobia and rarely vomiting. Occur mid-day. Usually last a couple of hours with Tylenol  and a nap and occure very 2 to 3 months.   Increased frequency in 2023    Past NSAIDS/analgesics:  none Past abortive triptans:  sumatriptan  100mg  (intolerance) Past abortive ergotamine:  none Past muscle relaxants:  none Past anti-emetic:  none Past antihypertensive medications:  none Past antidepressant medications:  none Past anticonvulsant medications:  none Past anti-CGRP:  none Past vitamins/Herbal/Supplements:  none Past antihistamines/decongestants:  none Other past therapies:  none  Caffeine:  2 cups coffee daily, 1 to 2 times a week a diet Coke or tea Diet:  40 oz water daily.  Skips lunch. Exercise:  No routine Depression:  no; Anxiety:  Yes.  Controlled on sertraline  Other pain:  no Sleep hygiene:  good Family history of headache:  Mom (migriane), twin sister (migraine with aura)  PAST MEDICAL HISTORY: Past Medical History:  Diagnosis Date   Anxiety    Depression    Encounter for procreative genetic counseling 01/06/2016  IBS (irritable bowel syndrome)    Irritable bowel syndrome    Neuromuscular disorder (HCC)    Other preterm infants, unspecified (weight)(765.10)    Scoliosis (and  kyphoscoliosis), idiopathic    Urinary tract infection, site not specified    UTI (urinary tract infection)     MEDICATIONS: Current Outpatient Medications on File Prior to Visit  Medication Sig Dispense Refill   cholecalciferol (VITAMIN D3) 25 MCG (1000 UNIT) tablet Take 1,000 Units by mouth daily.     Drospirenone (SLYND PO)      LAVENDER OIL PO Take 1 capsule by mouth daily.     meloxicam  (MOBIC ) 15 MG tablet Take 1 tablet (15 mg total) by mouth daily. 30 tablet 1   methocarbamol  (ROBAXIN ) 500 MG tablet Take 1 tablet (500 mg total) by mouth every 8 (eight) hours as needed for muscle spasms. 45 tablet 0   Multiple Vitamin (MULTIVITAMIN) tablet Take 1 tablet by mouth daily.     Omega-3 Fatty Acids (FISH OIL) 1000 MG CAPS Take 1 capsule by mouth daily.     oseltamivir  (TAMIFLU ) 75 MG capsule Take 1 capsule (75 mg total) by mouth 2 (two) times daily. 10 capsule 0   rizatriptan  (MAXALT -MLT) 10 MG disintegrating tablet Take 1 tablet (10 mg total) by mouth as needed for migraine. May repeat in 2 hours if needed.  Maximum 2 tablets in 24 hours. 10 tablet 5   sertraline  (ZOLOFT ) 50 MG tablet Take 1 tablet (50 mg total) by mouth daily. 90 tablet 1   SUMAtriptan  (IMITREX ) 100 MG tablet Take 1 tablet (100 mg total) by mouth as needed for migraine. May repeat in 2 hours if headache persists or recurs.  Maximum 2 tablets in 24 hours. 10 tablet 5   Ubrogepant  (UBRELVY ) 100 MG TABS Medication Samples have been provided to the patient.  Drug name: Ubrelvy        Strength: 100mg         Qty: 4 boxes  LOT: [id number]  Exp.Date: 02/2024  Dosing instructions:   The patient has been instructed regarding the correct time, dose, and frequency of taking this medication, including desired effects and most common side effects.   Karen Booker 10:00 AM 03/01/2023     No current facility-administered medications on file prior to visit.    ALLERGIES: Allergies  Allergen Reactions   Cephalexin     REACTION:  Rash all over just before finishing the course last time.   Nitrofurantoin     REACTION: rash and itching   Other Hives and Other (See Comments)    Tree nuts- hives in mouth    FAMILY HISTORY: Family History  Problem Relation Age of Onset   Mental illness Sister    Hyperlipidemia Mother    Alcohol abuse Father    Arthritis Maternal Grandmother    Diabetes Maternal Grandmother    Cancer Paternal Grandfather       Objective:  Blood pressure 125/82, pulse 98, resp. rate 20, height 5' 5 (1.651 m), weight 135 lb (61.2 kg), SpO2 98%. General: No acute distress.  Patient appears well-groomed.   Head:  Normocephalic/atraumatic Neck:  Supple.  No paraspinal tenderness.  Full range of motion. Heart:  Regular rate and rhythm. Neuro:  Alert and oriented.  Speech fluent and not dysarthric.  Language intact.  CN II-XII intact.  Bulk and tone normal.  Muscle strength 5/5 throughout.  Sensation to light touch intact.  Deep tendon reflexes 2+ throughout, toes downgoing.  Gait normal.  Romberg  negative.     Karen Dunnings, DO  CC: Comer Greet, MD

## 2023-09-27 ENCOUNTER — Encounter: Payer: Self-pay | Admitting: Neurology

## 2023-09-27 ENCOUNTER — Ambulatory Visit: Admitting: Neurology

## 2023-09-27 VITALS — BP 125/82 | HR 98 | Resp 20 | Ht 65.0 in | Wt 135.0 lb

## 2023-09-27 DIAGNOSIS — G43009 Migraine without aura, not intractable, without status migrainosus: Secondary | ICD-10-CM

## 2023-09-27 MED ORDER — RIZATRIPTAN BENZOATE 10 MG PO TBDP: 10.0000 mg | ORAL_TABLET | ORAL | 11 refills | Status: AC | PRN

## 2023-09-27 MED ORDER — UBRELVY 100 MG PO TABS: 1.0000 | ORAL_TABLET | ORAL | 11 refills | Status: AC | PRN

## 2023-09-27 NOTE — Patient Instructions (Signed)
 Take Ubrelvy  (or rizatriptan ) at earliest onset of migraine.  May repeat after 2 hours (maximum 2 doses in 24 hours)

## 2023-10-07 ENCOUNTER — Other Ambulatory Visit (HOSPITAL_COMMUNITY): Payer: Self-pay

## 2023-10-07 ENCOUNTER — Telehealth: Payer: Self-pay

## 2023-10-07 NOTE — Telephone Encounter (Signed)
 Pharmacy Patient Advocate Encounter   Received notification from CoverMyMeds that prior authorization for Ubrelvy  100MG  tablets is required/requested.   Insurance verification completed.   The patient is insured through [employer] [facility] .   Per test claim: PA required; PA started via CoverMyMeds. KEY [id number] . Waiting for clinical questions to populate.

## 2023-10-09 NOTE — Telephone Encounter (Signed)
 Pharmacy Patient Advocate Encounter  Per test claim: PA required; PA submitted to above mentioned insurance via Latent Key/confirmation #/EOC [id number]  Status is pending

## 2023-10-09 NOTE — Telephone Encounter (Signed)
 Pharmacy Patient Advocate Encounter  Received notification from [facility] that Prior Authorization for Ubrelvy  100MG  tablet has been APPROVED from 10-09-2023 to 10-08-2024   PA #/Case ID/Reference #: [id number]

## 2023-10-10 ENCOUNTER — Ambulatory Visit: Payer: BC Managed Care – PPO | Admitting: Neurology

## 2024-02-04 ENCOUNTER — Other Ambulatory Visit: Payer: Self-pay

## 2024-02-04 ENCOUNTER — Other Ambulatory Visit: Payer: Self-pay | Admitting: Family Medicine

## 2024-02-04 DIAGNOSIS — F418 Other specified anxiety disorders: Secondary | ICD-10-CM

## 2024-02-04 MED ORDER — SERTRALINE HCL 50 MG PO TABS: 50.0000 mg | ORAL_TABLET | Freq: Every day | ORAL | 0 refills | Status: DC

## 2024-02-04 NOTE — Telephone Encounter (Signed)
 Copied from CRM [id number]. Topic: Clinical - Medication Refill >> Feb 04, 2024  9:59 AM Pinkey ORN wrote: Medication: sertraline  (ZOLOFT ) 50 MG tablet  Has the patient contacted their pharmacy? Yes (Agent: If no, request that the patient contact the pharmacy for the refill. If patient does not wish to contact the pharmacy document the reason why and proceed with request.) (Agent: If yes, when and what did the pharmacy advise?)  This is the patient's preferred pharmacy:  [facility] [id number] [place], [place] - [address] [place] [address] Phone: [phone] Fax: [phone]  Is this the correct pharmacy for this prescription? Yes If no, delete pharmacy and type the correct one.   Has the prescription been filled recently? Yes  Is the patient out of the medication? No  Has the patient been seen for an appointment in the last year OR does the patient have an upcoming appointment? Yes  Can we respond through MyChart? Yes  Agent: Please be advised that Rx refills may take up to 3 business days. We ask that you follow-up with your pharmacy.

## 2024-03-12 ENCOUNTER — Ambulatory Visit: Payer: Self-pay | Admitting: Family Medicine

## 2024-03-12 ENCOUNTER — Encounter: Payer: Self-pay | Admitting: Family Medicine

## 2024-03-12 ENCOUNTER — Ambulatory Visit (INDEPENDENT_AMBULATORY_CARE_PROVIDER_SITE_OTHER): Payer: 59 | Admitting: Family Medicine

## 2024-03-12 ENCOUNTER — Other Ambulatory Visit: Payer: Self-pay

## 2024-03-12 VITALS — BP 98/66 | HR 97 | Ht 66.0 in | Wt 134.4 lb

## 2024-03-12 DIAGNOSIS — Z0000 Encounter for general adult medical examination without abnormal findings: Secondary | ICD-10-CM | POA: Diagnosis not present

## 2024-03-12 DIAGNOSIS — F418 Other specified anxiety disorders: Secondary | ICD-10-CM

## 2024-03-12 LAB — BASIC METABOLIC PANEL WITH GFR
BUN: 14 mg/dL (ref 6–23)
CO2: 30 meq/L (ref 19–32)
Calcium: 9.8 mg/dL (ref 8.4–10.5)
Chloride: 104 meq/L (ref 96–112)
Creatinine, Ser: 0.65 mg/dL (ref 0.40–1.20)
GFR: 113.58 mL/min
Glucose, Bld: 87 mg/dL (ref 70–99)
Potassium: 4.3 meq/L (ref 3.5–5.1)
Sodium: 138 meq/L (ref 135–145)

## 2024-03-12 LAB — CBC WITH DIFFERENTIAL/PLATELET
Basophils Absolute: 0.1 10*3/uL (ref 0.0–0.1)
Basophils Relative: 1.2 % (ref 0.0–3.0)
Eosinophils Absolute: 0.2 10*3/uL (ref 0.0–0.7)
Eosinophils Relative: 3.7 % (ref 0.0–5.0)
HCT: 43 % (ref 36.0–46.0)
Hemoglobin: 14.7 g/dL (ref 12.0–15.0)
Lymphocytes Relative: 34.3 % (ref 12.0–46.0)
Lymphs Abs: 1.6 10*3/uL (ref 0.7–4.0)
MCHC: 34.3 g/dL (ref 30.0–36.0)
MCV: 96.7 fl (ref 78.0–100.0)
Monocytes Absolute: 0.4 10*3/uL (ref 0.1–1.0)
Monocytes Relative: 8 % (ref 3.0–12.0)
Neutro Abs: 2.4 10*3/uL (ref 1.4–7.7)
Neutrophils Relative %: 52.8 % (ref 43.0–77.0)
Platelets: 311 10*3/uL (ref 150.0–400.0)
RBC: 4.45 Mil/uL (ref 3.87–5.11)
RDW: 11.8 % (ref 11.5–15.5)
WBC: 4.6 10*3/uL (ref 4.0–10.5)

## 2024-03-12 LAB — LIPID PANEL
Cholesterol: 137 mg/dL (ref 28–200)
HDL: 55.7 mg/dL
LDL Cholesterol: 66 mg/dL (ref 10–99)
NonHDL: 81.31
Total CHOL/HDL Ratio: 2
Triglycerides: 76 mg/dL (ref 10.0–149.0)
VLDL: 15.2 mg/dL (ref 0.0–40.0)

## 2024-03-12 LAB — HEPATIC FUNCTION PANEL
ALT: 21 U/L (ref 3–35)
AST: 20 U/L (ref 5–37)
Albumin: 4.7 g/dL (ref 3.5–5.2)
Alkaline Phosphatase: 56 U/L (ref 39–117)
Bilirubin, Direct: 0.2 mg/dL (ref 0.1–0.3)
Total Bilirubin: 1.1 mg/dL (ref 0.2–1.2)
Total Protein: 7.1 g/dL (ref 6.0–8.3)

## 2024-03-12 LAB — TSH: TSH: 1.23 u[IU]/mL (ref 0.35–5.50)

## 2024-03-12 MED ORDER — SERTRALINE HCL 50 MG PO TABS: 50.0000 mg | ORAL_TABLET | Freq: Every day | ORAL | 3 refills | Status: AC

## 2024-03-12 NOTE — Patient Instructions (Signed)
 Follow up in 1 year or as needed We'll notify you of your lab results and make any changes if needed Keep up the good work!  You look great! Call with any questions or concerns Stay Safe!  Stay Healthy! Happy Belated Birthday!!!

## 2024-03-12 NOTE — Progress Notes (Signed)
" ° °  Subjective:    Patient ID: Karen Booker, female    DOB: 1990/05/13, 34 y.o.   MRN: [id number]  HPI CPE-UTD on pap, Tdap.  Health Maintenance  Topic Date Due   Hepatitis B Vaccines 19-59 Average Risk (1 of 3 - 19+ 3-dose series) Never done   Cervical Cancer Screening (HPV/Pap Cotest)  05/05/2024   DTaP/Tdap/Td (2 - Td or Tdap) 07/23/2027   Influenza Vaccine  Completed   HPV VACCINES (No Doses Required) Completed   COVID-19 Vaccine  Completed   Hepatitis C Screening  Completed   HIV Screening  Completed   Pneumococcal Vaccine  Aged Out   Meningococcal B Vaccine  Aged Out     Patient Care Team    Relationship Specialty Notifications Start End  Mahlon Comer BRAVO, MD PCP - General Family Medicine  03/22/22   Marne Kelly Nest, MD Consulting Physician Obstetrics and Gynecology  09/17/19   Skeet Juliene SAUNDERS, DO Consulting Physician Neurology  04/17/22       Review of Systems Patient reports no vision/ hearing changes, adenopathy,fever, weight change,  persistant/recurrent hoarseness , swallowing issues, chest pain, palpitations, edema, persistant/recurrent cough, hemoptysis, dyspnea (rest/exertional/paroxysmal nocturnal), gastrointestinal bleeding (melena, rectal bleeding), abdominal pain, significant heartburn, bowel changes, GU symptoms (dysuria, hematuria, incontinence), Gyn symptoms (abnormal  bleeding, pain),  syncope, focal weakness, memory loss, numbness & tingling, skin/hair/nail changes, abnormal bruising or bleeding, anxiety, or depression.     Objective:   Physical Exam General Appearance:    Alert, cooperative, no distress, appears stated age  Head:    Normocephalic, without obvious abnormality, atraumatic  Eyes:    PERRL, conjunctiva/corneas clear, EOM's intact both eyes  Ears:    Normal TM's and external ear canals, both ears  Nose:   Nares normal, septum midline, mucosa normal, no drainage    or sinus tenderness  Throat:   Lips, mucosa, and tongue normal; teeth and gums  normal  Neck:   Supple, symmetrical, trachea midline, no adenopathy;    Thyroid : no enlargement/tenderness/nodules  Back:     Symmetric, no curvature, ROM normal, no CVA tenderness  Lungs:     Clear to auscultation bilaterally, respirations unlabored  Chest Wall:    No tenderness or deformity   Heart:    Regular rate and rhythm, S1 and S2 normal, no murmur, rub   or gallop  Breast Exam:    Deferred to GYN  Abdomen:     Soft, non-tender, bowel sounds active all four quadrants,    no masses, no organomegaly  Genitalia:    Deferred to GYN  Rectal:    Extremities:   Extremities normal, atraumatic, no cyanosis or edema  Pulses:   2+ and symmetric all extremities  Skin:   Skin color, texture, turgor normal, no rashes or lesions  Lymph nodes:   Cervical, supraclavicular, and axillary nodes normal  Neurologic:   CNII-XII intact, normal strength, sensation and reflexes    throughout          Assessment & Plan:    "

## 2024-03-12 NOTE — Assessment & Plan Note (Signed)
Pt's PE WNL.  UTD on pap, Tdap.  Check labs.  Anticipatory guidance provided.  

## 2024-03-12 NOTE — Progress Notes (Signed)
 Lab results have been discussed.   Verbalized understanding? Yes  Are there any questions? No

## 2024-10-02 ENCOUNTER — Ambulatory Visit: Admitting: Neurology

## 2025-03-15 ENCOUNTER — Encounter: Admitting: Family Medicine
